# Patient Record
Sex: Male | Born: 1960 | Race: White | Hispanic: No | Marital: Married | Smoking: Never smoker
Health system: Southern US, Community
[De-identification: ages and names within clinical notes are randomized; demographics above are authoritative.]

## PROBLEM LIST (undated history)

## (undated) DIAGNOSIS — E785 Hyperlipidemia, unspecified: Secondary | ICD-10-CM

## (undated) DIAGNOSIS — I781 Nevus, non-neoplastic: Secondary | ICD-10-CM

## (undated) DIAGNOSIS — F419 Anxiety disorder, unspecified: Secondary | ICD-10-CM

## (undated) DIAGNOSIS — R000 Tachycardia, unspecified: Secondary | ICD-10-CM

## (undated) DIAGNOSIS — N400 Enlarged prostate without lower urinary tract symptoms: Secondary | ICD-10-CM

## (undated) DIAGNOSIS — M5430 Sciatica, unspecified side: Secondary | ICD-10-CM

## (undated) HISTORY — DX: Hyperlipidemia, unspecified: E78.5

## (undated) HISTORY — DX: Nevus, non-neoplastic: I78.1

## (undated) HISTORY — PX: VASECTOMY: SHX75

## (undated) HISTORY — DX: Benign prostatic hyperplasia without lower urinary tract symptoms: N40.0

## (undated) HISTORY — DX: Sciatica, unspecified side: M54.30

## (undated) HISTORY — DX: Tachycardia, unspecified: R00.0

## (undated) HISTORY — DX: Anxiety disorder, unspecified: F41.9

---

## 2010-07-12 ENCOUNTER — Telehealth: Payer: Self-pay | Admitting: *Deleted

## 2010-07-12 ENCOUNTER — Telehealth: Payer: Self-pay | Admitting: Cardiovascular Disease

## 2010-07-12 NOTE — Telephone Encounter (Signed)
Informed no caffeine 12 hrs prior and nothing to eat 4hrs prior to stress echo, pt not on beta blocker. Pt verbalized understanding. Alfonso Ramus RN

## 2010-07-12 NOTE — Telephone Encounter (Signed)
Recvd message from patient asking for price of Stress Echo advised I can only offer a rough estimate.  Patient is going to have the Nuclear Stress Test and Echo at [facility] which is scheduled on  Thurs 07/14/10.  Patient asked how early we could get the Stress Echo, called and per [facility] we can do this on 07/21/10 4pm.  Spoke with patient, he wants to have this done.  Called and scheduled test.

## 2010-07-15 ENCOUNTER — Encounter: Payer: Self-pay | Admitting: *Deleted

## 2010-07-15 ENCOUNTER — Encounter: Payer: Self-pay | Admitting: Cardiovascular Disease

## 2010-07-15 ENCOUNTER — Ambulatory Visit (INDEPENDENT_AMBULATORY_CARE_PROVIDER_SITE_OTHER): Payer: BC Managed Care – PPO | Admitting: Cardiovascular Disease

## 2010-07-15 VITALS — BP 120/78 | HR 72 | Wt 202.0 lb

## 2010-07-15 DIAGNOSIS — R000 Tachycardia, unspecified: Secondary | ICD-10-CM

## 2010-07-15 DIAGNOSIS — I781 Nevus, non-neoplastic: Secondary | ICD-10-CM | POA: Insufficient documentation

## 2010-07-15 DIAGNOSIS — J309 Allergic rhinitis, unspecified: Secondary | ICD-10-CM | POA: Insufficient documentation

## 2010-07-15 DIAGNOSIS — E785 Hyperlipidemia, unspecified: Secondary | ICD-10-CM

## 2010-07-15 DIAGNOSIS — M5430 Sciatica, unspecified side: Secondary | ICD-10-CM | POA: Insufficient documentation

## 2010-07-15 DIAGNOSIS — F419 Anxiety disorder, unspecified: Secondary | ICD-10-CM | POA: Insufficient documentation

## 2010-07-15 NOTE — Assessment & Plan Note (Signed)
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 We had long discussion concerning the mechanism of AVNRT. He brought up the possibility that this could be Wolff-Parkinson-White. I told him that the cardiologist in [facility] recognized that very quickly but instead the cardiologist told him that he had AV nodal reentrant tachycardia.   I did not repeat the EKG today since we'll be getting an EKG next Thursday.  We will schedule him for a stress test next week. We've had a long discussion regarding the termination of tachycardia including Valsalva maneuver, stimulation of the diving reflex, and carotid sinus massage. At this point, Francisco Harris does not really want to have an EP study with RF ablation. So far his symptoms have only occurred once every 4  years and it  seems to be a fairly aggressive approach for a problem that only happens every several years. I'll see him in one month.

## 2010-07-15 NOTE — Patient Instructions (Signed)
Stres echo next Thursday. [address]

## 2010-07-15 NOTE — Progress Notes (Signed)
Francisco Harris Date of Birth  [birth year] [facility] / [facility] [address]     [address] [place], [place]  [address] [phone]  Fax  [phone]  History of Present Illness:  Francisco Harris is a middle-age gentleman who has known for many years.  We went to high school together. He presents today for a second opinion regarding episodes of tachycardia.  Francisco Harris has been a very healthy man all his life. He played competitive football and played [employer] and  for the [employer].  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  He has noticed episodes of tachycardia all his life. These typically occur at various times related to exercise. They would only last a second or 2 and were not associated with any specific complications such as dizziness, shortness of breath or chest pain. He had one particularly bad episode 3 years ago.  Workup at that time was negative.  Week or so ago he had a prolonged episode of tachycardia. This episode occurred right after he had completed a 3 mile run.  It is described as a rapid tachycardia. It lasted about 30 minutes. It eventually broke.  He was seen by his medical doctor and then by cardiologist in [facility]. It was recommended that he have a stress nuclear study and an echocardiogram. It was also suggested that he probably needs an EP study with ablation of his AV node reentrant tachycardia.  There was no mention of WPW.  He called me for second opinion. I recommended that we do a stress echocardiogram which should give [place] this information regarding his exercise capacity and The possibility of ischemia.  He's not had any episodes since that time.  He has a second kind of palpitation which clinically sounds like a premature ventricular contraction. He's had this for years. The last service per second and are not associated with shortness breath, chest pain, syncope, or presyncope.   Current Outpatient  Prescriptions  Medication Sig Dispense Refill  . fenofibrate (TRICOR) 145 MG tablet Take 145 mg by mouth daily.           No Known Allergies  Past Medical History  Diagnosis Date  . Hyperlipidemia   . Tachycardia     History of Intermittent Tachycardia  . Allergic rhinitis   . Anxiety disorder   . Benign prostatic hypertrophy   . Bladder outlet obstruction   . Nevus, non-neoplastic     right (of the back)  . Sciatica     Sciatica radiculopathy     Past Surgical History  Procedure Date  . Vasectomy     History  Smoking status  . Never Smoker   Smokeless tobacco  . Never Used    History  Alcohol Use No    Family History  Problem Relation Age of Onset  . Alzheimer's disease    . Cancer      Carcinoma of The Large Intestin   . Colon cancer    . Liver cancer    . Hypertension Paternal Uncle   . Heart disease Neg Hx     Reviw of Systems:  Reviewed in the HPI.  All other systems are negative.  Physical Exam: BP 120/78  Pulse 72  Wt 202 lb (91.627 kg) The patient is alert and oriented x 3.  The mood and affect are normal.  The skin is warm and dry.  Color is normal.  The HEENT exam reveals  that the sclera are nonicteric.  The mucous membranes are moist.  The carotids are 2+ without bruits.  There is no thyromegaly.  There is no JVD.  The lungs are clear.  The chest wall is non tender.  The heart exam reveals a regular rate with a Split S1 and S2.  There are no murmurs, gallops, or rubs.  The PMI is not displaced.   Abdominal exam reveals good bowel sounds.  There is no guarding or rebound.  There is no hepatosplenomegaly or tenderness.  There are no masses.  Exam of the legs reveal no clubbing, cyanosis, or edema.  The legs are without rashes.  The distal pulses are intact.  Cranial nerves II - XII are intact.  Motor and sensory functions are intact.  The gait is normal.  Assessment / Plan:

## 2010-07-15 NOTE — Assessment & Plan Note (Signed)
He brought in his labs from his medical doctor. His triglyceride level is 80. His LDL is 114. I've asked him to continue with TriCor. He eats a very low fat diet. He works out on a regular basis.

## 2010-07-19 ENCOUNTER — Other Ambulatory Visit (HOSPITAL_COMMUNITY): Payer: Self-pay | Admitting: Cardiovascular Disease

## 2010-07-19 DIAGNOSIS — I471 Supraventricular tachycardia: Secondary | ICD-10-CM

## 2010-07-21 ENCOUNTER — Ambulatory Visit (HOSPITAL_COMMUNITY): Payer: BC Managed Care – PPO | Attending: Cardiovascular Disease

## 2010-07-21 ENCOUNTER — Ambulatory Visit (HOSPITAL_COMMUNITY): Payer: BC Managed Care – PPO | Attending: Cardiovascular Disease | Admitting: Radiology

## 2010-07-21 ENCOUNTER — Other Ambulatory Visit (HOSPITAL_COMMUNITY): Payer: Self-pay

## 2010-07-21 DIAGNOSIS — E785 Hyperlipidemia, unspecified: Secondary | ICD-10-CM | POA: Insufficient documentation

## 2010-07-21 DIAGNOSIS — R5381 Other malaise: Secondary | ICD-10-CM | POA: Insufficient documentation

## 2010-07-21 DIAGNOSIS — R0989 Other specified symptoms and signs involving the circulatory and respiratory systems: Secondary | ICD-10-CM

## 2010-07-21 DIAGNOSIS — I471 Supraventricular tachycardia, unspecified: Secondary | ICD-10-CM | POA: Insufficient documentation

## 2010-08-22 ENCOUNTER — Ambulatory Visit: Payer: BC Managed Care – PPO | Admitting: Cardiovascular Disease

## 2010-09-01 ENCOUNTER — Ambulatory Visit: Payer: BC Managed Care – PPO | Admitting: Cardiovascular Disease

## 2010-09-02 ENCOUNTER — Ambulatory Visit (INDEPENDENT_AMBULATORY_CARE_PROVIDER_SITE_OTHER): Payer: BC Managed Care – PPO | Admitting: Cardiovascular Disease

## 2010-09-02 ENCOUNTER — Encounter: Payer: Self-pay | Admitting: Cardiovascular Disease

## 2010-09-02 VITALS — BP 112/78 | HR 64 | Ht 72.0 in | Wt 204.0 lb

## 2010-09-02 DIAGNOSIS — I471 Supraventricular tachycardia: Secondary | ICD-10-CM

## 2010-09-02 DIAGNOSIS — R000 Tachycardia, unspecified: Secondary | ICD-10-CM

## 2010-09-02 DIAGNOSIS — I498 Other specified cardiac arrhythmias: Secondary | ICD-10-CM

## 2010-09-02 NOTE — Assessment & Plan Note (Signed)
Francisco Harris continues to do well. He's not having any further episodes of tachycardia. He has propranolol available if needed. I'll see him again in one year for followup visit. He is to call me sooner if he has any further episodes of SVT.

## 2010-09-02 NOTE — Progress Notes (Signed)
Francisco Harris Date of Birth  Jan 27, 1961 [facility] / [facility] [address]     [address] [place], [place]  [address] [phone]  Fax  [phone]  History of Present Illness:  Francisco Harris is a middle-aged gentleman with a history of palpitations which are thought to be do to supraventricular tachycardia and AV nodal reentrant tachycardia. He's done quite well since I last saw him. He had one episode of SVT which resolved with a Valsalva maneuver.  He's had a stress echocardiogram which was normal. He's been able to do all his normal activities without any significant problems. He is now exercising on a daily basis.  Current Outpatient Prescriptions on File Prior to Visit  Medication Sig Dispense Refill  . fenofibrate (TRICOR) 145 MG tablet Take 145 mg by mouth daily.          No Known Allergies  Past Medical History  Diagnosis Date  . Hyperlipidemia   . Tachycardia     History of Intermittent Tachycardia  . Allergic rhinitis   . Anxiety disorder   . Benign prostatic hypertrophy   . Bladder outlet obstruction   . Nevus, non-neoplastic     right (of the back)  . Sciatica     Sciatica radiculopathy     Past Surgical History  Procedure Date  . Vasectomy     History  Smoking status  . Never Smoker   Smokeless tobacco  . Never Used    History  Alcohol Use No    Family History  Problem Relation Age of Onset  . Alzheimer's disease    . Cancer      Carcinoma of The Large Intestin   . Colon cancer    . Liver cancer    . Hypertension Paternal Uncle   . Heart disease Neg Hx     Reviw of Systems:  Reviewed in the HPI.  All other systems are negative.  Physical Exam: BP 112/78  Pulse 64  Ht 6' (1.829 m)  Wt 204 lb (92.534 kg)  BMI 27.67 kg/m2 The patient is alert and oriented x 3.  The mood and affect are normal.   Skin: warm and dry.  Color is normal.    HEENT:   the sclera are nonicteric.  The mucous membranes are moist.  The  carotids are 2+ without bruits.  There is no thyromegaly.  There is no JVD.    Lungs: clear.  The chest wall is non tender.    Heart: regular rate with a normal S1 and  A split S2.  .. The PMI is not displaced.     Abdomin: good bowel sounds.  There is no guarding or rebound.  There is no hepatosplenomegaly or tenderness.  There are no masses.   Extremities:  no clubbing, cyanosis, or edema.  The legs are without rashes.  The distal pulses are intact.   Neuro:  Cranial nerves II - XII are intact.  Motor and sensory functions are intact.    The gait is normal.  Assessment / Plan:

## 2011-10-23 ENCOUNTER — Ambulatory Visit (INDEPENDENT_AMBULATORY_CARE_PROVIDER_SITE_OTHER): Payer: BC Managed Care – PPO | Admitting: Cardiovascular Disease

## 2011-10-23 ENCOUNTER — Encounter: Payer: Self-pay | Admitting: Cardiovascular Disease

## 2011-10-23 VITALS — BP 117/80 | HR 61 | Ht 72.0 in | Wt 209.0 lb

## 2011-10-23 DIAGNOSIS — E785 Hyperlipidemia, unspecified: Secondary | ICD-10-CM

## 2011-10-23 NOTE — Progress Notes (Signed)
Francisco Harris is doing very well. He's not had any further episodes of SVT. We'll continue with current medications. I'll see him again in one year for office visit, EKG, and fasting labs.

## 2011-10-23 NOTE — Assessment & Plan Note (Signed)
He continues on Tricor.  I don't have his most recent fasting lab work. He will fax me his most recent labs.  His medical doctor is managing his lipids.

## 2011-10-23 NOTE — Patient Instructions (Addendum)
Your physician wants you to follow-up in: 1 year  You will receive a reminder letter in the mail two months in advance. If you don't receive a letter, please call our office to schedule the follow-up appointment.   Your physician recommends that you return for a FASTING lipid profile: 1 year   

## 2011-11-13 ENCOUNTER — Telehealth: Payer: Self-pay | Admitting: *Deleted

## 2011-11-13 NOTE — Telephone Encounter (Signed)
received paper work in [employer] copies of lab work from [employer], left msg asking him to call and update contact numbers. Cell ID'd   his wife only and home number has recorder of a different name.

## 2012-09-17 ENCOUNTER — Encounter: Payer: Self-pay | Admitting: Cardiology

## 2012-09-20 ENCOUNTER — Ambulatory Visit: Payer: Self-pay | Admitting: Gastroenterology

## 2012-09-24 LAB — PATHOLOGY REPORT

## 2013-01-03 ENCOUNTER — Encounter: Payer: Self-pay | Admitting: Cardiovascular Disease

## 2013-01-16 ENCOUNTER — Encounter: Payer: Self-pay | Admitting: Cardiovascular Disease

## 2013-01-16 ENCOUNTER — Other Ambulatory Visit: Payer: Self-pay | Admitting: Cardiovascular Disease

## 2013-01-16 ENCOUNTER — Ambulatory Visit (INDEPENDENT_AMBULATORY_CARE_PROVIDER_SITE_OTHER): Payer: BC Managed Care – PPO | Admitting: Cardiovascular Disease

## 2013-01-16 VITALS — BP 112/86 | HR 61 | Ht 72.0 in | Wt 204.0 lb

## 2013-01-16 DIAGNOSIS — I498 Other specified cardiac arrhythmias: Secondary | ICD-10-CM

## 2013-01-16 DIAGNOSIS — I471 Supraventricular tachycardia: Secondary | ICD-10-CM

## 2013-01-16 DIAGNOSIS — E785 Hyperlipidemia, unspecified: Secondary | ICD-10-CM

## 2013-01-16 MED ORDER — PROPRANOLOL HCL 10 MG PO TABS: 10.0000 mg | ORAL_TABLET | Freq: Four times a day (QID) | ORAL | Status: DC

## 2013-01-16 NOTE — Progress Notes (Signed)
     Francisco Harris Date of Birth  10-Apr-1960       [facility]    [place] [address]  [address] [place], [place]  [address]   [place], [place]  [address] [phone]     [phone]   Fax  [phone]    Fax [phone]  Problem List: 1. SVT 2. hyperlipidemia  History of Present Illness:  Francisco Harris is doing well.  He has not had any episodes of SVT.  Exercises regularly.  Runs , lifts weights, swims regularly.   He has had 2-3 episodes of tachycardia that resolved very quickly after a valsalva.  No CP , no dypsnea.  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   No current outpatient prescriptions on file prior to visit.   No current facility-administered medications on file prior to visit.    No Known Allergies  Past Medical History  Diagnosis Date  . Hyperlipidemia   . Tachycardia     History of Intermittent Tachycardia  . Allergic rhinitis   . Anxiety disorder   . Benign prostatic hypertrophy   . Bladder outlet obstruction   . Nevus, non-neoplastic     right (of the back)  . Sciatica     Sciatica radiculopathy     Past Surgical History  Procedure Laterality Date  . Vasectomy      History  Smoking status  . Never Smoker   Smokeless tobacco  . Never Used    History  Alcohol Use No    Family History  Problem Relation Age of Onset  . Alzheimer's disease    . Cancer      Carcinoma of The Large Intestin   . Colon cancer    . Liver cancer    . Hypertension Paternal Uncle   . Heart disease Neg Hx     Reviw of Systems:  Reviewed in the HPI.  All other systems are negative.  Physical Exam: Blood pressure 112/86, pulse 61, height 6' (1.829 m), weight 204 lb (92.534 kg). General: Well developed, well nourished, in no acute distress.  Head: Normocephalic, atraumatic, sclera non-icteric, mucus membranes are moist,   Neck: Supple. Carotids are 2 + without bruits. No JVD   Lungs: Clear    Heart: RR, normal S1. S2  Abdomen: Soft, non-tender, non-distended with normal bowel sounds.  Msk:  Strength and tone are normal   Extremities: No clubbing or cyanosis. No edema.  Distal pedal pulses are 2+ and equal    Neuro: CN II - XII intact.  Alert and oriented X 3.   Psych:  Normal  ECG: Jan 16, 2013:  [employer] RBBB  Assessment / Plan:

## 2013-01-16 NOTE — Assessment & Plan Note (Signed)
He has a strong family hx of hyperlipidemia.  His Trigs are normal - LDL is 152. Will get a screening CT for coronary calcium scoring.  This may help [place] decide if he needs to start a statin   Will see him in 1 year.

## 2013-01-16 NOTE — Patient Instructions (Addendum)
Your physician wants you to follow-up in: 1 year  You will receive a reminder letter in the mail two months in advance. If you don't receive a letter, please call our office to schedule the follow-up appointment.  Your physician has recommended you make the following change in your medication:  Start propranolol 10 mg tablet as needed for palpitations. Take one every thirty minutes up to four doses.  You will get a call tomorrow to schedule the calcium screening test

## 2013-01-16 NOTE — Assessment & Plan Note (Signed)
Francisco Harris has done well.  Only has brief episodes of Afib and these resolve with a valsalva.   Will refill his propranolol - he has not needed it in years.

## 2013-01-23 ENCOUNTER — Ambulatory Visit (INDEPENDENT_AMBULATORY_CARE_PROVIDER_SITE_OTHER)
Admission: RE | Admit: 2013-01-23 | Discharge: 2013-01-23 | Disposition: A | Discharge status: Home / Self Care | Payer: BC Managed Care – PPO | Source: Ambulatory Visit | Attending: Cardiovascular Disease | Admitting: Cardiovascular Disease

## 2013-01-23 DIAGNOSIS — I498 Other specified cardiac arrhythmias: Secondary | ICD-10-CM

## 2013-01-23 DIAGNOSIS — E785 Hyperlipidemia, unspecified: Secondary | ICD-10-CM

## 2013-01-23 DIAGNOSIS — I471 Supraventricular tachycardia: Secondary | ICD-10-CM

## 2013-01-24 ENCOUNTER — Telehealth: Payer: Self-pay | Admitting: Cardiovascular Disease

## 2013-01-24 NOTE — Telephone Encounter (Signed)
Phone call   coronary calcium score of 0. He has mild hyperlipidemia.  Continue healthy diet and exercise, No medications for not   Vesta Mixer, Montez Hageman., MD, [facility] 01/24/2013, 5:52 PM Office - [phone] Pager [phone]

## 2013-11-07 ENCOUNTER — Encounter: Payer: Self-pay | Admitting: Cardiovascular Disease

## 2013-11-07 ENCOUNTER — Ambulatory Visit (INDEPENDENT_AMBULATORY_CARE_PROVIDER_SITE_OTHER): Payer: BC Managed Care – PPO | Admitting: Cardiovascular Disease

## 2013-11-07 VITALS — BP 120/80 | HR 58 | Ht 72.0 in | Wt 204.8 lb

## 2013-11-07 DIAGNOSIS — I471 Supraventricular tachycardia: Secondary | ICD-10-CM

## 2013-11-07 DIAGNOSIS — I498 Other specified cardiac arrhythmias: Secondary | ICD-10-CM

## 2013-11-07 NOTE — Patient Instructions (Signed)
Your physician recommends that you continue on your current medications as directed. Please refer to the Current Medication list given to you today.  Your physician wants you to follow-up in: 1 year. You will receive a reminder letter in the mail two months in advance. If you don't receive a letter, please call our office to schedule the follow-up appointment.  

## 2013-11-07 NOTE — Assessment & Plan Note (Signed)
[name] has been  doing much better at this point. He had a prolonged episode of supraventricular tachycardia several weeks ago. He is been drinking more V8 and has been staying hydrated on a regular basis.  I suspect that his episode of SVT was related to electrolyte abnormalities or dehydration because of activities as a [employer].    That long discussion regarding RF ablation for his SVT. For now he is only having rare episodes occurred every 3 years or so. I do not think that he necessarily needs to consider RF ablation at this time.  He's had some lab work with his medical Dr. We will have him bring over his lab work. I would want to make sure that he has a TSH drawn at some point soon.

## 2013-11-07 NOTE — Progress Notes (Signed)
Francisco Harris Date of Birth  [birth year]       [facility] Office [address]  [address] [place], [place]  [address]   [place], [place]  [address] [phone]     [phone]   Fax  [phone]    Fax [phone]  Problem List: 1. SVT 2. hyperlipidemia  History of Present Illness:  Francisco Harris is doing well.  He has not had any episodes of SVT.  Exercises regularly.  Runs , lifts weights, swims regularly.   He has had 2-3 episodes of tachycardia that resolved very quickly after a valsalva.  No CP , no dypsnea.  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   Sept. 4, 2015:  Francisco Harris had a long episode of atrial fib several weeks ago.    He was out coaching during the practice session. It was very hot and humid. He had an episode lasted 25-30 minutes. He tried   multiple Valsalva maneuvers but these were not successful. Eventually came inside and applied a cold towel to [place] for it and had some cold water. Eventually the SVT resolved and has not returned since that time.  He was a bit more fatigued the next day but overall has recovered nicely.   He has not taken any propranolol.      Current Outpatient Prescriptions on File Prior to Visit  Medication Sig Dispense Refill  . propranolol (INDERAL) 10 MG tablet TAKE 1 TABLET BY MOUTH FOUR TIMES DAILY FOR PALPITATIONS  385 tablet  3   No current facility-administered medications on file prior to visit.    No Known Allergies  Past Medical History  Diagnosis Date  . Hyperlipidemia   . Tachycardia     History of Intermittent Tachycardia  . Allergic rhinitis   . Anxiety disorder   . Benign prostatic hypertrophy   . Bladder outlet obstruction   . Nevus, non-neoplastic     right (of the back)  . Sciatica     Sciatica radiculopathy     Past Surgical History  Procedure Laterality Date  . Vasectomy      History  Smoking status  . Never Smoker   Smokeless  tobacco  . Never Used    History  Alcohol Use No    Family History  Problem Relation Age of Onset  . Alzheimer's disease    . Cancer      Carcinoma of The Large Intestin   . Colon cancer    . Liver cancer    . Hypertension Paternal Uncle   . Heart disease Neg Hx     Reviw of Systems:  Reviewed in the HPI.  All other systems are negative.  Physical Exam: Blood pressure 120/80, pulse 58, height 6' (1.829 m), weight 204 lb 12 oz (92.874 kg). General: Well developed, well nourished, in no acute distress.  Head: Normocephalic, atraumatic, sclera non-icteric, mucus membranes are moist,   Neck: Supple. Carotids are 2 + without bruits. No JVD   Lungs: Clear   Heart: RR, normal S1. S2  Abdomen: Soft, non-tender, non-distended with normal bowel sounds.  Msk:  Strength and tone are normal   Extremities: No clubbing or cyanosis. No edema.  Distal pedal pulses are 2+ and equal    Neuro: CN II - XII intact.  Alert and oriented X 3.   Psych:  Normal  ECG: Jan 16, 2013:  [employer] RBBB  Assessment /  Plan:

## 2014-06-22 IMAGING — CT CT HEART SCORING
1 of 3 series · 10 of 20 positions shown, 13 images · non-contrast
Comparison: None.

CLINICAL DATA: Risk stratification

EXAM:
Coronary Calcium Score
TECHNIQUE: The patient was scanned on a Siemens Sensation 16 slice scanner.
Axial non-contrast 3mm slices were carried out through the heart.
The data set was analyzed on a dedicated work station and scored
using the Agatson method.

[Series 6: st thins for reformat · axial · 0.71mm/px · z∈[-260,-146]mm · 10 of 140 slices shown, 13 images]
[im 13/140  vessel]
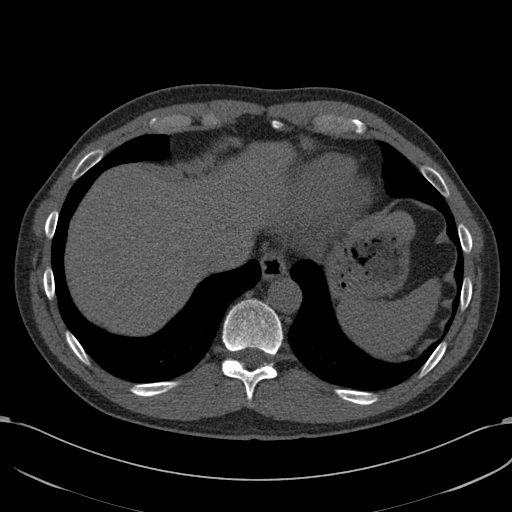
[im 13/140  lung]
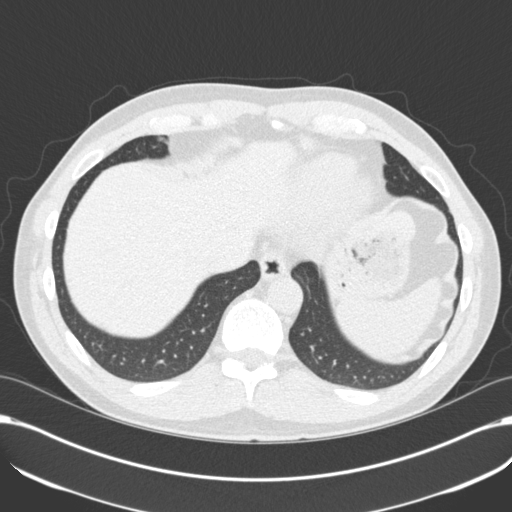
[im 26/140  vessel]
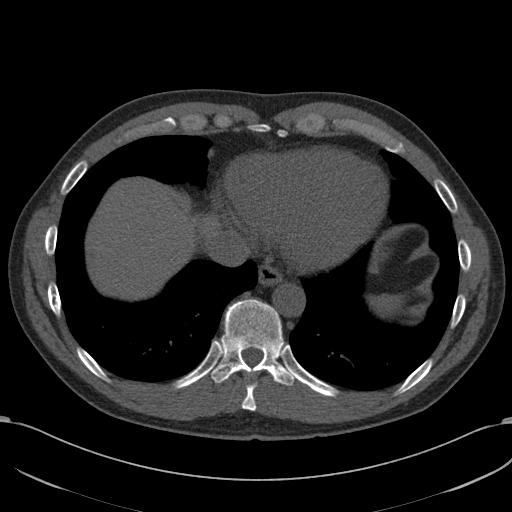
[im 38/140  vessel]
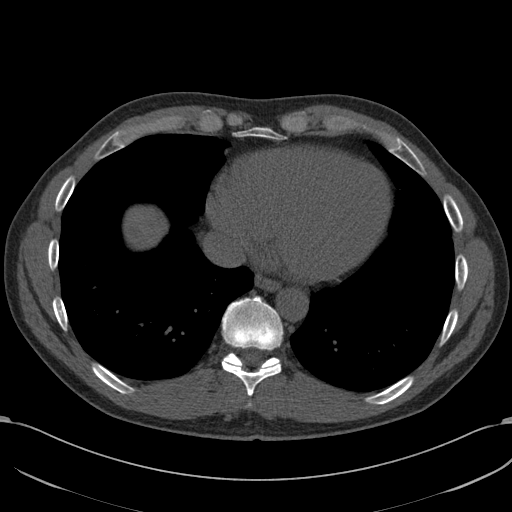
[im 51/140  vessel]
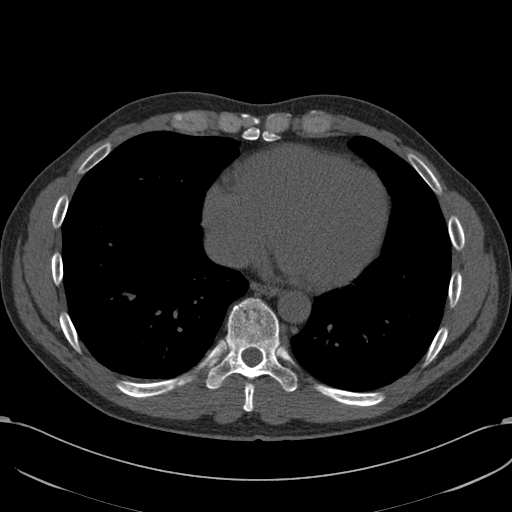
[im 64/140  vessel]
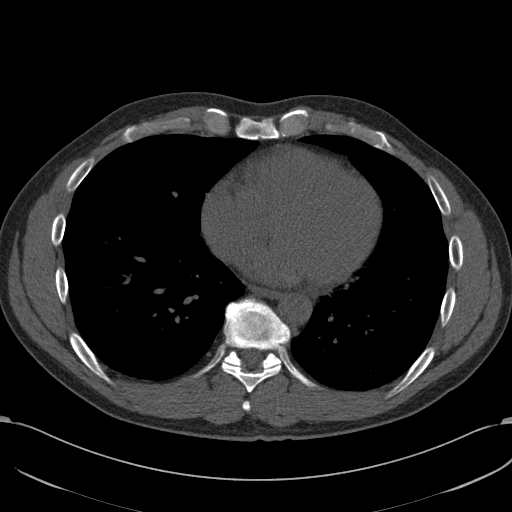
[im 64/140  lung]
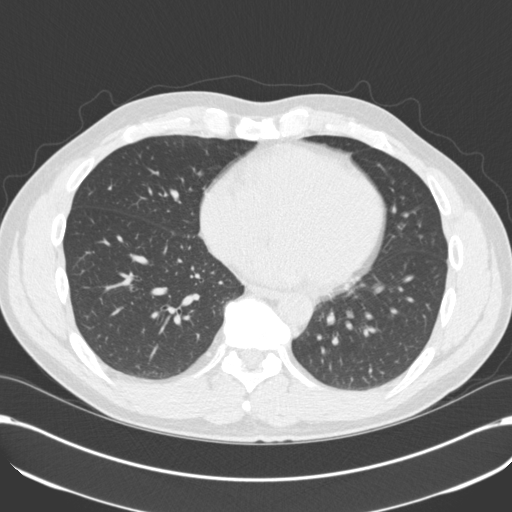
[im 76/140  vessel]
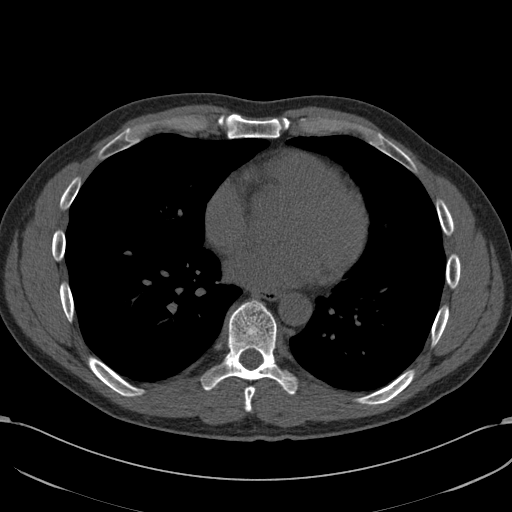
[im 89/140  vessel]
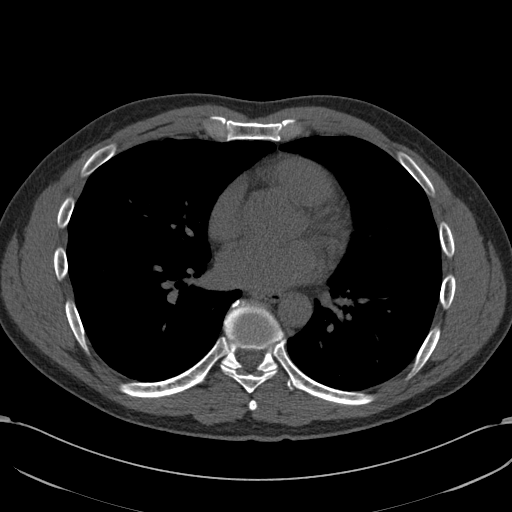
[im 102/140  vessel]
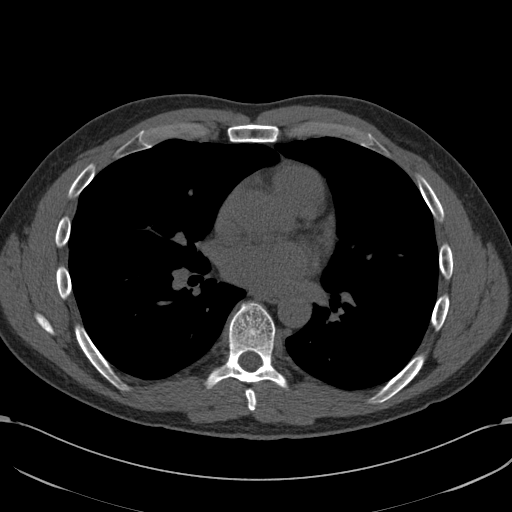
[im 114/140  vessel]
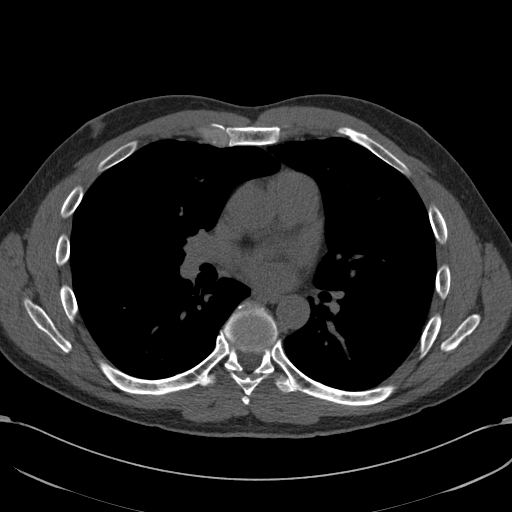
[im 114/140  lung]
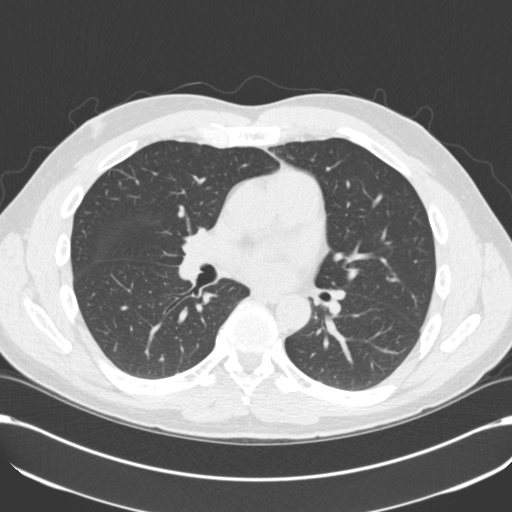
[im 127/140  vessel]
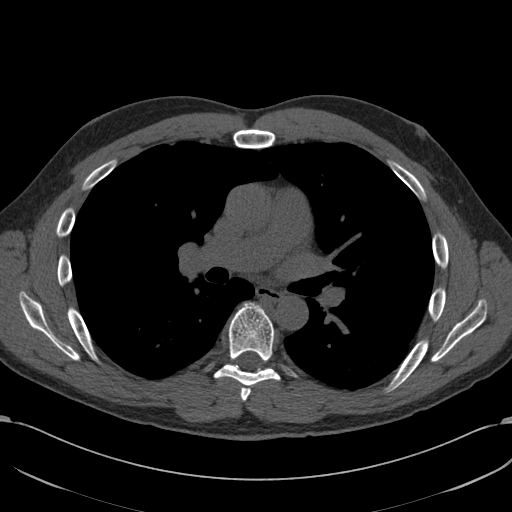

[10 of 20 positions shown; findings below may reference images not displayed]

FINDINGS: Non-cardiac: No significant non cardiac findings on limited lung and
soft tissue windows. See separate report from [REDACTED].

Ascending Aorta:  3.2 cm

Pericardium: Normal

Coronary arteries:  No calcium detected
IMPRESSION: Coronary calcium score of 0.

Enchalew Mallon

EXAM:
OVER-READ INTERPRETATION  CT CHEST

The following report is an over-read performed by radiologist Dr.
over-read does not include interpretation of cardiac or coronary
anatomy or pathology. The cardiac interpretation by the cardiologist
is attached.
FINDINGS: No pulmonary nodules. No chest wall abnormality. Degenerative
osteophytosis of the thoracic spine. .

Limited view of the upper abdomen is unremarkable.
IMPRESSION: No worrisome extracardiac findings.

Degenerative spurring of thoracic spine.

## 2015-01-27 ENCOUNTER — Encounter: Payer: Self-pay | Admitting: Cardiovascular Disease

## 2015-01-27 ENCOUNTER — Ambulatory Visit (INDEPENDENT_AMBULATORY_CARE_PROVIDER_SITE_OTHER): Payer: BC Managed Care – PPO | Admitting: Cardiovascular Disease

## 2015-01-27 VITALS — BP 130/72 | HR 54 | Ht 72.0 in | Wt 207.8 lb

## 2015-01-27 DIAGNOSIS — I471 Supraventricular tachycardia: Secondary | ICD-10-CM | POA: Diagnosis not present

## 2015-01-27 NOTE — Patient Instructions (Signed)
Medication Instructions:  Your physician recommends that you continue on your current medications as directed. Please refer to the Current Medication list given to you today.   Labwork: none  Testing/Procedures: none  Follow-Up: Your physician recommends that you schedule a follow-up appointment in: one year with Dr. [place].    Any Other Special Instructions Will Be Listed Below (If Applicable).     If you need a refill on your cardiac medications before your next appointment, please call your pharmacy.

## 2015-01-27 NOTE — Progress Notes (Signed)
Francisco Harris,Francisco Harris Date of Birth  03/16/1960       [facility]    [place] [facility] [address]  [address], [address] [place], [place]  [address]   [place], [place]  [address] [phone]     [phone]   Fax  [phone]    Fax [phone]  Problem List: 1. SVT 2. hyperlipidemia  History of Present Illness:  Francisco Harris is doing well.  He has not had any episodes of SVT.  Exercises regularly.  Runs , lifts weights, swims regularly.   He has had 2-3 episodes of tachycardia that resolved very quickly after a valsalva.  No CP , no dypsnea.  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   Sept. 4, 2015:  Francisco Harris had a long episode of  SVT  several weeks ago.    He was out coaching during the practice session. It was very hot and humid. He had an episode lasted 25-30 minutes. He tried   multiple Valsalva maneuvers but these were not successful. Eventually came inside and applied a cold towel to us for it and had some cold water. Eventually the SVT resolved and has not returned since that time.  He was a bit more fatigued the next day but overall has recovered nicely.   He has not taken any propranolol.    11/23/2016[name]:  Francisco Harris is doing well.  Occasional episodes of SVT Exercising regularly .   His cholesterol is a bit high .  Is rertired from teaching and coaching   Current Outpatient Prescriptions on File Prior to Visit  Medication Sig Dispense Refill  . propranolol (INDERAL) 10 MG tablet TAKE 1 TABLET BY MOUTH FOUR TIMES DAILY FOR PALPITATIONS 385 tablet 3   No current facility-administered medications on file prior to visit.    No Known Allergies  Past Medical History  Diagnosis Date  . Hyperlipidemia   . Tachycardia     History of Intermittent Tachycardia  . Allergic rhinitis   . Anxiety disorder   . Benign prostatic hypertrophy   . Bladder outlet obstruction   . Nevus, non-neoplastic     right (of the back)  . Sciatica    Sciatica radiculopathy     Past Surgical History  Procedure Laterality Date  . Vasectomy      History  Smoking status  . Never Smoker   Smokeless tobacco  . Never Used    History  Alcohol Use No    Family History  Problem Relation Age of Onset  . Alzheimer's disease    . Cancer      Carcinoma of The Large Intestin   . Colon cancer    . Liver cancer    . Hypertension Paternal Uncle   . Heart disease Neg Hx     Reviw of Systems:  Reviewed in the HPI.  All other systems are negative.  Physical Exam: Blood pressure 130/72, pulse 54, height 6' (1.829 m), weight 207 lb 12 oz (94.235 kg). General: Well developed, well nourished, in no acute distress.  Head: Normocephalic, atraumatic, sclera non-icteric, mucus membranes are moist,   Neck: Supple. Carotids are 2 + without bruits. No JVD   Lungs: Clear   Heart: RR, normal S1. S2  Abdomen: Soft, non-tender, non-distended with normal bowel sounds.  Msk:  Strength and tone are normal   Extremities: No clubbing or cyanosis. No edema.  Distal pedal pulses are 2+ and equal    Neuro:  CN II - XII intact.  Alert and oriented X 3.   Psych:  Normal  ECG: 01/27/2015: Sinus bradycardia at 54. EKG is otherwise normal.  Assessment / Plan:   1. SVT Able to do a valsalva an converts himself back to NSR .  No real issues Very active Continue with same medications and plan. We'll see him in one year..   2. Mild hypercholesterolemia. He follows up with his general medical doctor. He'll increase his exercise and decrease his intake of high fat foods. He'll call me if he needs any further assistance with this.     Adiel Erney, Deloris Ping, MD  01/27/2015 12:15 PM    [facility] [address] [place],  [address] [place], [place]  [address] Pager [phone] Phone: [phone]; Fax: [phone]   [facility]  [address] [place], [place]  [address] [phone]   Fax [phone]

## 2016-01-21 ENCOUNTER — Encounter: Payer: Self-pay | Admitting: Cardiovascular Disease

## 2016-02-01 ENCOUNTER — Encounter: Payer: Self-pay | Admitting: Cardiovascular Disease

## 2016-02-01 ENCOUNTER — Encounter (INDEPENDENT_AMBULATORY_CARE_PROVIDER_SITE_OTHER): Payer: Self-pay

## 2016-02-01 ENCOUNTER — Ambulatory Visit (INDEPENDENT_AMBULATORY_CARE_PROVIDER_SITE_OTHER): Payer: BC Managed Care – PPO | Admitting: Cardiovascular Disease

## 2016-02-01 VITALS — BP 140/94 | HR 59 | Ht 72.0 in | Wt 218.8 lb

## 2016-02-01 DIAGNOSIS — I10 Essential (primary) hypertension: Secondary | ICD-10-CM | POA: Diagnosis not present

## 2016-02-01 DIAGNOSIS — I471 Supraventricular tachycardia: Secondary | ICD-10-CM

## 2016-02-01 MED ORDER — PROPRANOLOL HCL 10 MG PO TABS: 10.0000 mg | ORAL_TABLET | Freq: Four times a day (QID) | ORAL | 6 refills | Status: DC | PRN

## 2016-02-01 NOTE — Patient Instructions (Signed)
Medication Instructions:  Your physician recommends that you continue on your current medications as directed. Please refer to the Current Medication list given to you today.   Labwork: None Ordered   Testing/Procedures: None Ordered   Follow-Up: Your physician wants you to follow-up in: 1 year with Dr. Nahser.  You will receive a reminder letter in the mail two months in advance. If you don't receive a letter, please call our office to schedule the follow-up appointment.   If you need a refill on your cardiac medications before your next appointment, please call your pharmacy.   Thank you for choosing [facility]! Jens Siems, RN [phone]    

## 2016-02-01 NOTE — Progress Notes (Signed)
Francisco Harris,Francisco Harris Date of Birth  04/27/1960       [facility]    [place] [facility] [address]  [address] [place], [place]  [address]   [place], [place]  [address] [phone]     [phone]   Fax  [phone]    Fax [phone]  Problem List: 1. SVT 2. hyperlipidemia    Francisco Harris is doing well.  He has not had any episodes of SVT.  Exercises regularly.  Runs , lifts weights, swims regularly.   He has had 2-3 episodes of tachycardia that resolved very quickly after a valsalva.  No CP , no dypsnea.  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   Sept. 4, 2015:  Francisco Harris had a long episode of  SVT  several weeks ago.    He was out coaching during the practice session. It was very hot and humid. He had an episode lasted 25-30 minutes. He tried   multiple Valsalva maneuvers but these were not successful. Eventually came inside and applied a cold towel to Francisco Harris for it and had some cold water. Eventually the SVT resolved and has not returned since that time.  He was a bit more fatigued the next day but overall has recovered nicely.   He has not taken any propranolol.    11/23/2016[name]:  Francisco Harris is doing well.  Occasional episodes of SVT Exercising regularly .   His cholesterol is a bit high .  Is rertired from teaching and coaching  Nov. 28, 2017:  Francisco Harris is seen today for follow-up of his supraventricular tachycardia.   BP is a bit elevated today . Ate a bit more salt this past Thanksgiving weekend.   Has brief episodes of SVT every 5 weeks or so He is able to convert himself with the valsalva.  Has not had to take a propranolol   Current Outpatient Prescriptions on File Prior to Visit  Medication Sig Dispense Refill  . propranolol (INDERAL) 10 MG tablet TAKE 1 TABLET BY MOUTH FOUR TIMES DAILY FOR PALPITATIONS (Patient not taking: Reported on 02/01/2016) 385 tablet 3   No current facility-administered medications on file prior to  visit.     No Known Allergies  Past Medical History:  Diagnosis Date  . Allergic rhinitis   . Anxiety disorder   . Benign prostatic hypertrophy   . Bladder outlet obstruction   . Hyperlipidemia   . Nevus, non-neoplastic    right (of the back)  . Sciatica    Sciatica radiculopathy   . Tachycardia    History of Intermittent Tachycardia    Past Surgical History:  Procedure Laterality Date  . VASECTOMY      History  Smoking Status  . Never Smoker  Smokeless Tobacco  . Never Used    History  Alcohol Use No    Family History  Problem Relation Age of Onset  . Alzheimer's disease    . Cancer      Carcinoma of The Large Intestin   . Colon cancer    . Liver cancer    . Hypertension Paternal Uncle   . Heart disease Neg Hx     Reviw of Systems:  Reviewed in the HPI.  All other systems are negative.  Physical Exam: Blood pressure (!) 140/94, pulse (!) 59, height 6' (1.829 m), weight 218 lb 12.8 oz (99.2 kg). General: Well developed, well nourished, in no acute distress.  Head: Normocephalic, atraumatic, sclera  non-icteric, mucus membranes are moist,   Neck: Supple. Carotids are 2 + without bruits. No JVD   Lungs: Clear   Heart: RR, normal S1. S2  Abdomen: Soft, non-tender, non-distended with normal bowel sounds.  Msk:  Strength and tone are normal   Extremities: No clubbing or cyanosis. No edema.  Distal pedal pulses are 2+ and equal    Neuro: CN II - XII intact.  Alert and oriented X 3.   Psych:  Normal  ECG: 02/01/16    Sinus brady at 59.   INc. RBBB   Assessment / Plan:   1. SVT Able to do a valsalva an converts himself back to NSR .  No real issues Very active Continue with same medications and plan. We'll see him in one year..   2. Mild hypercholesterolemia. He follows up with his general medical doctor. He'll increase his exercise and decrease his intake of high fat foods. He'll call me if he needs any further assistance with this.  3.  Hypertension: Francisco Harris presents today with very minimal elevation of his blood pressure. He's been eating a little bit of extra salt recent. I suspect that this is the cause. I have encouraged him to watch his salt intake and to resume his regular aerobic exercise regimen. He'll continue to follow his blood pressures and will send Francisco Harris his blood pressure readings in several weeks.   Kristeen MissPhilip Amira Podolak, MD  02/01/2016 11:00 AM    [facility] [address],  [address] [place], [place]  [address] Pager [phone]- [phone] Phone: [phone](336) [phone]; Fax: [phone]   [facility]  [address] [place], [place]  [address] [phone]   Fax [phone]

## 2016-02-02 ENCOUNTER — Telehealth: Payer: Self-pay | Admitting: Cardiovascular Disease

## 2016-02-02 NOTE — Telephone Encounter (Signed)
[facility] and pt needs to clarify some info that was discussed yesterday-pt aware Marcelino DusterMichelle out this pm

## 2016-02-02 NOTE — Telephone Encounter (Signed)
Pt states [facility] and had further questions about taking BP, what makes BP high, and how to report BPs.  I reviewed how to take a proper BP and when to take it. I reviewed HTN etiology and genetic vs. not genetic. I advised pt to return to normal exercise routine and healthy diet (per note @ 02/02/16 OV). I reviewed low sodium/heart healthy diet.  I advised him to take BPs in the am before exercise and pm after at least 10 minutes of rest.  I advised him to record HR and BP, cb in 2 weeks and report to Dr. Elease HashimotoNahser. He voiced understanding, thanks, and agreed with plan.

## 2016-02-25 MED ORDER — LOSARTAN POTASSIUM 50 MG PO TABS: 50.0000 mg | ORAL_TABLET | Freq: Every day | ORAL | 11 refills | Status: DC

## 2016-02-25 NOTE — Addendum Note (Signed)
Addended by: Levi AlandSWINYER, MICHELLE M on: 02/25/2016 01:58 PM   Modules accepted: Orders

## 2016-02-25 NOTE — Telephone Encounter (Signed)
Lets try low dose Losartan 50 mg a day  - typically very well tolerated.  We may have to add HCTZ if the BP remains elevated  BMP in 3 weeks.

## 2016-02-25 NOTE — Telephone Encounter (Signed)
Spoke with patient and reviewed Dr. Harvie BridgeNahser's advice.  Patient verbalized understanding and agreement to start Losartan 50 mg.  I advised him to continue to monitor BP and call us back to report and to stay well hydrated.  I advised if he gets dizzy or light-headed after starting the medication, to decrease dose to 1/2 tab and call us to report.  He verbalized understanding and agreement with plan.

## 2016-02-25 NOTE — Telephone Encounter (Signed)
Called patient to check on his BP readings.  He reports BP is excellent on the days that he exercises vigorously. Reports systolic readings of 110's to 161130 mmHg and diastolic readings of 70's to 86 mmHg. He reports BP readings that are higher on the days he does not exercise, even when he is feeling relaxed.  He is consistently getting readings of 140's to 158 mmHg systolic and 86 to 96 mmHg diastolic.  States he has been more conscious of eating less sodium since his office visit and is concerned about the high readings despite changes in his diet. I advised that I will forward to Dr. Elease HashimotoNahser for review and call him back with his advice.  Patient verbalized understanding and agreement and thanked me for the call.

## 2016-03-15 ENCOUNTER — Telehealth: Payer: Self-pay | Admitting: Cardiovascular Disease

## 2016-03-15 DIAGNOSIS — E7801 Familial hypercholesterolemia: Secondary | ICD-10-CM

## 2016-03-15 NOTE — Telephone Encounter (Signed)
°  New Prob   Pt has some questions regarding most recent follow up. Please call.

## 2016-03-15 NOTE — Telephone Encounter (Signed)
Called, LM to CB

## 2016-03-20 NOTE — Telephone Encounter (Signed)
His BP readings are great He is on a low dose Losartan and seems to be tolerating it well He can decrease the dose to 25 mg if he would like  His weight loss has likely had a beneficial effect on his lipids We should get a fasting lipid profile soon.

## 2016-03-20 NOTE — Telephone Encounter (Signed)
Patient wanted to discuss recent BP readings and find out if Dr. Elease HashimotoNahser thinks he will always have to take losartan 50 mg.     Since starting losartan on 12/22, he has increased exercise intensity , doing cardio 30-45 minutes 6-7 days per week.  Has been really watching sodium, and he now weighs 210 lbs.  (8 pounds less than at OV in Nov).  Most recent daily BPs: 120/75, 117/79, 120/66, 107/71, 118/73    Had one higher reading during a day he was unusually stressed: 148/87.   I advised that his lifestyle changes are playing a part in his improved readings, but also the small dose of losartan may also be helping.        Advised patient to continue losartan, and that I am forwarding to Dr. Elease HashimotoNahser for review and any new recommendations.  He will continue to monitor BP daily.

## 2016-03-21 NOTE — Telephone Encounter (Signed)
Left message for patient to call back  

## 2016-03-31 NOTE — Telephone Encounter (Signed)
These readings look good

## 2016-03-31 NOTE — Telephone Encounter (Signed)
Spoke with patient and advised him that Dr. Elease HashimotoNahser would like to get fasting cholesterol level soon. He states he is due for lab work with his PCP so he will call to find out when that will be done and will request cholesterol (which he states they usually check anyway). He will provide results to us once they are complete.  In regards to his BP, he states he has been very careful about checking his BP consistently and has focused on reducing sodium even further.  He states he is now swimming daily.  His last week of BP readings: 1/25 116/79 1/24 111/77 1/23 122/81 1/22 117/71 1/21 109/76 1/20 126/80 1/19 122/79 Stopped losartan yesterday and was going to monitor BP for 1 week to see if BP remains low off medication since he has reduced sodium and does 30 min of cardiovascular exercise daily. I advised him to call back next week to report numbers.  He verbalized understanding and agreement and thanked me for the call.

## 2016-04-13 MED ORDER — LOSARTAN POTASSIUM 25 MG PO TABS: 25.0000 mg | ORAL_TABLET | Freq: Every day | ORAL | 3 refills | Status: DC

## 2016-04-13 NOTE — Telephone Encounter (Signed)
His BP looks great   I would like to have him get a Lipomed profile ( LDL particle number ) to help us decide what to do with the elevated LDL  I think this is about as good as his lipids are going to get with diet and exercise.

## 2016-04-13 NOTE — Telephone Encounter (Signed)
Received call from patient to follow-up on BP readings and to report lab work.  He reports the following:  Total cholesterol 230 HDL 41.9 Triglycerides 95 LDL 169 I asked him to mail hard copies of his lab work to our office. He states he has family hx of hyperlipidemia (mother, father, grandparents, siblings) but no one has had CAD that he is aware of. He states he does not understand why his triglycerides decreased from 152 to 95 but HDL only improved slightly. He states he has significantly improved his exercise regimen to 5 to 7 days/week and has improved diet.  He reports eating healthy fat including salmon and have decreased intake of red meat to 2 days per week. He states he took tricor in the past (2008) which significantly improved his numbers but his PCP took him off the medication in 2012. He states [place] because his numbers didn't make sense for his lifestyle and physical exam. He has years of lab work available and I advised him that I will route this to Dr. Elease HashimotoNahser and that he may want additional information in the future.  In regards to his BP, he states he has been getting systolic readings of 110-120 mmHg and diastolic readings of 60-70 mmHg on losartan 25 mg daily. He reports he feels great. I advised him to continue current medications. He is aware that Dr. Elease HashimotoNahser will review information about cholesterol and that I will call him back in the near future to discuss his thoughts. He thanked me for the call.

## 2016-04-14 NOTE — Telephone Encounter (Signed)
Spoke with patient and advised him to continue losartan 25 mg once daily for BP.  Advised him that Dr. Elease HashimotoNahser would like to get NMR to evaluate particle number before making a decision on treating cholesterol.  He states he was given Rx for pravastatin by PCP and was going to get it filled. I advised that he will need to decide who is going to manage his cholesterol, us or PCP. He states he would like to proceed with getting this lab test and then discuss results with Dr. Elease HashimotoNahser. He asked me to note that he took tricor for 6 years and had excellent results - was taken off by his current PCP because he said he didn't like the medication. He would like to know Dr. Harvie BridgeNahser's opinion about tricor (fenofibrate).  [place] in [place] closer [place]. I advised I will send a message to the [place] office to get that scheduled and will call him back with that information. He verbalized understanding and agreement and thanked me for the call.

## 2016-04-17 NOTE — Telephone Encounter (Signed)
Received message from our [place] office that patient is scheduled for lab appointment on 2/19. [facility].

## 2016-04-24 ENCOUNTER — Other Ambulatory Visit
Admission: RE | Admit: 2016-04-24 | Discharge: 2016-04-24 | Disposition: A | Discharge status: Home / Self Care | Payer: BC Managed Care – PPO | Source: Ambulatory Visit | Attending: Cardiovascular Disease | Admitting: Cardiovascular Disease

## 2016-04-24 ENCOUNTER — Other Ambulatory Visit: Payer: Self-pay

## 2016-04-24 ENCOUNTER — Other Ambulatory Visit: Payer: BC Managed Care – PPO

## 2016-04-24 ENCOUNTER — Telehealth: Payer: Self-pay | Admitting: Cardiovascular Disease

## 2016-04-24 DIAGNOSIS — E782 Mixed hyperlipidemia: Secondary | ICD-10-CM | POA: Insufficient documentation

## 2016-04-24 DIAGNOSIS — E7801 Familial hypercholesterolemia: Secondary | ICD-10-CM

## 2016-04-24 DIAGNOSIS — E78019 Familial hypercholesterolemia, unspecified: Secondary | ICD-10-CM

## 2016-04-24 NOTE — Telephone Encounter (Signed)
Follow Up    Alona BeneJoyce from [facility] calling about pt labs. States she was transferred several times and keeps getting the wrong dept.

## 2016-04-24 NOTE — Telephone Encounter (Signed)
Alona [facility]Joyce at [facility] was not sure which lab order to order for patient's NMR lipid. The lab at our office  stated that she would need to use the Black and Gold tubes for blood test. Looked up list on [facility] website, and matched test with the one that uses Black and Gold tubes.

## 2016-04-26 LAB — MISC LABCORP TEST (SEND OUT): Labcorp test code: 884247

## 2016-05-02 ENCOUNTER — Telehealth: Payer: Self-pay | Admitting: Cardiovascular Disease

## 2016-05-02 MED ORDER — ROSUVASTATIN CALCIUM 10 MG PO TABS: 10.0000 mg | ORAL_TABLET | Freq: Every day | ORAL | 11 refills | Status: DC

## 2016-05-02 NOTE — Telephone Encounter (Signed)
Left detailed message on patient's voice mail regarding Dr. Harvie BridgeNahser's advice to start rosuvastatin 10 mg daily. I advised we will recheck blood work in 3 months and advised him to call back with questions or concerns.

## 2016-05-02 NOTE — Telephone Encounter (Signed)
His LDL particle number is >1700 Pravachol will not be strong enough. Lets try Rosuvastatin 10 mg a day Check lipids and NMR lipid panel in 3 months

## 2016-05-05 ENCOUNTER — Telehealth: Payer: Self-pay | Admitting: Cardiovascular Disease

## 2016-05-05 DIAGNOSIS — E785 Hyperlipidemia, unspecified: Secondary | ICD-10-CM

## 2016-05-05 NOTE — Telephone Encounter (Signed)
Called ,  Left message that I would try back later today  Or on Monday     Kristeen MissPhilip Nahser, MD  05/05/2016 12:55 PM    [facility] [address]  [address] [place], [place]  [address] Pager [phone]- [phone] Phone: [phone](336) [phone]; Fax: [phone]) [phone]

## 2016-05-05 NOTE — Telephone Encounter (Signed)
New message    Pt is calling about Michelle's message-he has questions about his blood work.

## 2016-05-05 NOTE — Telephone Encounter (Signed)
Follow Up:    Returning your call from 05-02-16,concerning his Rosuvastatin.

## 2016-05-05 NOTE — Telephone Encounter (Signed)
Received call from patient. I advised him that Dr. Elease HashimotoNahser was trying to call him to discuss the need to start Rosuvastatin. I reviewed the patient's lab results and Dr. Elease HashimotoNahser' advice and reasoning for prescribing Rosuvastatin 10 mg. I answered his questions to his satisfaction. I advised him to call back if he does not tolerate this dose. I advised that he will need repeat lab work in 3 months and advised him that I will call him closer to time to schedule. I requested that he have lab work completed at our office so that we can order all of the components of the particle number and lipid profile that we like. He verbalized understanding and agreement with plan and thanked me for the call.

## 2016-05-10 ENCOUNTER — Telehealth: Payer: Self-pay | Admitting: Cardiovascular Disease

## 2016-05-10 NOTE — Telephone Encounter (Signed)
Left message for patient that it is OK to take both of the medications. Instructed him to call with any questions or concerns.

## 2016-05-10 NOTE — Telephone Encounter (Signed)
New Message     Pt has questions about  losartan (COZAAR) 25 MG tablet Take 1 tablet (25 mg total) by mouth daily.   rosuvastatin (CRESTOR) 10 MG tablet Take 1 tablet (10 mg total) by mouth daily.   Will there be an interaction with either of these medications

## 2016-08-03 ENCOUNTER — Telehealth: Payer: Self-pay | Admitting: Nurse Practitioner

## 2016-08-03 NOTE — Telephone Encounter (Signed)
-----   Message from Francisco AlandMichelle M Swinyer, RN sent at 05/02/2016  1:47 PM EST ----- Recheck 3 mo fasting labs  Patient starting rosuvastatin 10 mg 2/27

## 2016-08-03 NOTE — Telephone Encounter (Signed)
Spoke with patient about scheduling 3 month repeat fasting lab work. He states he has lab work scheduled with PCP on Monday. I advised him that we specifically wanted to check a repeat NMR to determine if rosuvastatin is effective for elevated particle number.  He states he will send results to our office and if additional lab work is needed for Dr. Elease HashimotoNahser, that we can schedule at that time. He thanked me for the call.

## 2016-08-09 NOTE — Telephone Encounter (Signed)
Spoke with patient who states repeat fasting lab work done by PCP shows: Total chol 141 Triglycerides 105 HDL 45.5 LDL 75  He would like to know if he needs further lab testing. We had previously planned to repeat particle number at this time. I advised him to place copies of cmet and lipid panel in mail to our office and that once those are received and reviewed by Dr. Elease HashimotoNahser, I will call him back with the plan. He is currently due for follow-up in November. I advised we may get a NMR particle number prior to that visit. He verbalized understanding and agreement with plan and thanked me for the call.

## 2016-08-09 NOTE — Telephone Encounter (Signed)
Mr. Dan HumphreysMebane is calling to give you the lab results . Please Call

## 2016-08-23 NOTE — Telephone Encounter (Signed)
Spoke with patient and advised that per Dr. Elease HashimotoNahser in light of recent lab results he mailed to us, he should continue current medications and have particle number in November prior to next ov. Patient verbalized understanding and agreement with plan and thanked me for the call.

## 2017-01-01 ENCOUNTER — Telehealth: Payer: Self-pay | Admitting: Cardiovascular Disease

## 2017-01-01 NOTE — Telephone Encounter (Signed)
There is no information in this message

## 2017-01-01 NOTE — Telephone Encounter (Signed)
New Message °

## 2017-01-03 NOTE — Telephone Encounter (Signed)
New Message:     Pt wants to know if will need lab work since he just had lab work at his primary doctor? He said he had received a message that he need lab work here. He is scheduled to see Dr Elease HashimotoNahser in January.

## 2017-01-03 NOTE — Telephone Encounter (Signed)
Left message for patient that NMR is scheduled for tomorrow and if he has had this test done by PCP he can cancel. I advised him to call back with questions or concerns.

## 2017-01-03 NOTE — Telephone Encounter (Signed)
Spoke with patient and scheduled his fasting NMR test for 12/4. He is overdue for his yearly appointment so I scheduled him to see Dr. Elease HashimotoNahser on 12/10 and is aware to bring copies of his lab results to his appointment.

## 2017-01-03 NOTE — Telephone Encounter (Signed)
New Message   pt verbalized that he is returning call for rn   He has questions he has local blood word done

## 2017-01-04 ENCOUNTER — Other Ambulatory Visit: Payer: BC Managed Care – PPO

## 2017-02-06 ENCOUNTER — Other Ambulatory Visit: Payer: BC Managed Care – PPO

## 2017-02-06 DIAGNOSIS — E785 Hyperlipidemia, unspecified: Secondary | ICD-10-CM

## 2017-02-07 LAB — LIPID PANEL
CHOLESTEROL TOTAL: 156 mg/dL (ref 100–199)
Chol/HDL Ratio: 3.3 ratio (ref 0.0–5.0)
HDL: 48 mg/dL (ref >39)
LDL CALC: 89 mg/dL (ref 0–99)
TRIGLYCERIDES: 94 mg/dL (ref 0–149)
VLDL CHOLESTEROL CAL: 19 mg/dL (ref 5–40)

## 2017-02-07 LAB — NMR, LIPOPROFILE
Cholesterol: 156 mg/dL (ref 100–199)
HDL CHOLESTEROL BY NMR: 47 mg/dL (ref >39)
HDL PARTICLE NUMBER: 33.8 umol/L (ref >=30.5)
LDL Particle Number: 975 nmol/L (ref <1000)
LDL Size: 20.6 nm (ref >20.5)
LDL-C: 90 mg/dL (ref 0–99)
LP-IR Score: 44 (ref <=45)
Small LDL Particle Number: 327 nmol/L (ref <=527)
Triglycerides by NMR: 96 mg/dL (ref 0–149)

## 2017-02-07 LAB — APOLIPOPROTEIN B: Apolipoprotein B: 87 mg/dL (ref 52–135)

## 2017-02-07 LAB — LIPOPROTEIN A (LPA): LIPOPROTEIN (A): 149 nmol/L — AB (ref <75)

## 2017-02-12 ENCOUNTER — Ambulatory Visit: Payer: BC Managed Care – PPO | Admitting: Cardiovascular Disease

## 2017-02-16 ENCOUNTER — Encounter: Payer: Self-pay | Admitting: *Deleted

## 2017-02-22 ENCOUNTER — Encounter: Payer: Self-pay | Admitting: Cardiovascular Disease

## 2017-02-22 ENCOUNTER — Ambulatory Visit: Payer: BC Managed Care – PPO | Admitting: Cardiovascular Disease

## 2017-02-22 VITALS — BP 116/88 | HR 56 | Ht 72.0 in | Wt 222.0 lb

## 2017-02-22 DIAGNOSIS — E785 Hyperlipidemia, unspecified: Secondary | ICD-10-CM | POA: Diagnosis not present

## 2017-02-22 NOTE — Progress Notes (Signed)
Francisco MustyAlexander Ertle Jr. Date of Birth  08/26/1960       [facility]    [place] [facility] [address], [address]  [address], [address] [place], [place]  [address]   [place], [place]  [address] [phone]     [phone]   Fax  [phone]    Fax [phone]  Problem List: 1. SVT 2. hyperlipidemia    Francisco Harris is doing well.  Francisco Harris has not had any episodes of SVT.  Exercises regularly.  Runs , lifts weights, swims regularly.   Francisco Harris has had 2-3 episodes of tachycardia that resolved very quickly after a valsalva.  No CP , no dypsnea.  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   Sept. 4, 2015:  Francisco Harris had a long episode of  SVT  several weeks ago.    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 It was very hot and humid. Francisco Harris had an episode lasted 25-30 minutes. Francisco Harris tried   multiple Valsalva maneuvers but these were not successful. Eventually came inside and applied a cold towel to us for it and had some cold water. Eventually the SVT resolved and has not returned since that time.  Francisco Harris was a bit more fatigued the next day but overall has recovered nicely.   Francisco Harris has not taken any propranolol.    11/23/2016[name]:  Francisco Harris is doing well.  Occasional episodes of SVT Exercising regularly .   His cholesterol is a bit high .  Is rertired from teaching and coaching  Nov. 28, 2017:  Francisco Harris is seen today for follow-up of his supraventricular tachycardia.   BP is a bit elevated today . Ate a bit more salt this past Thanksgiving weekend.   Has brief episodes of SVT every 5 weeks or so Francisco Harris is able to convert himself with the valsalva.  Has not had to take a propranolol   Dec. 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   Doing well from an SVT standpoint  - no further episodes of SVT Seems to be better now that Francisco Harris is staying better hydrated.  BP has been well controlled  Now on Losartan 25 mg a day  - BP has been  great as long as Francisco Harris exercises regularly .    Current Outpatient Medications on File Prior to Visit  Medication Sig Dispense Refill  . losartan (COZAAR) 25 MG tablet Take 1 tablet (25 mg total) by mouth daily. 90 tablet 3  . propranolol (INDERAL) 10 MG tablet Take 1 tablet (10 mg total) by mouth 4 (four) times daily as needed. 90 tablet 6  . rosuvastatin (CRESTOR) 10 MG tablet Take 1 tablet (10 mg total) by mouth daily. 30 tablet 11   No current facility-administered medications on file prior to visit.     No Known Allergies  Past Medical History:  Diagnosis Date  . Allergic rhinitis   . Anxiety disorder   . Benign prostatic hypertrophy   . Bladder outlet obstruction   . Hyperlipidemia   . Nevus, non-neoplastic    right (of the back)  . Sciatica    Sciatica radiculopathy   . Tachycardia    History of Intermittent Tachycardia    Past Surgical History:  Procedure Laterality Date  . VASECTOMY      Social History   Tobacco Use  Smoking Status Never Smoker  Smokeless Tobacco Never Used  Social History   Substance and Sexual Activity  Alcohol Use No    Family History  Problem Relation Age of Onset  . Alzheimer's disease Unknown   . Cancer Unknown        Carcinoma of The Large Intestin   . Colon cancer Unknown   . Liver cancer Unknown   . Hypertension Paternal Uncle   . Heart disease Neg Hx     Reviw of Systems:  Reviewed in the HPI.  All other systems are negative.  Physical Exam: There were no vitals taken for this visit.  GEN:  Well nourished, well developed in no acute distress HEENT: Normal NECK: No JVD; No carotid bruits LYMPHATICS: No lymphadenopathy CARDIAC: RR, no murmurs, rubs, gallops RESPIRATORY:  Clear to auscultation without rales, wheezing or rhonchi  ABDOMEN: Soft, non-tender, non-distended MUSCULOSKELETAL:  No edema; No deformity  SKIN: Warm and dry NEUROLOGIC:  Alert and oriented x 3    ECG: Dec. 20,  2018:   Sinus brady at 56.   Otherwise normal ECG  Assessment / Plan:   1. SVT -his episodes of SVT are well controlled.  Francisco Harris only has rare episodes which are well controlled with Valsalva.  Francisco Harris has not had to take the propranolol in a very long time.  2. Mild hypercholesterolemia.   Continue Crestor.  Francisco Harris is LDL particle number is 975.  His lipoprotein V is normal.  His lipoprotein a is elevated at 149.  We discussed the fact that we really did not have good medications to lower this level that we keep an eye on this.  We will recheck it again next year.  3. Hypertension: -Blood pressure is on the low normal size.  Francisco Harris is on a low-dose of losartan.  I have given him the okay to stop the losartan if his blood pressure goes any lower.   Kristeen MissPhilip Zadyn Yardley, MD  02/22/2017 9:34 AM    [facility] [address],  [address] [place], [place]  [address] Pager [phone] Phone: [phone]; Fax: [phone]   [facility]  [address] [place], [place]  [address] [phone]   Fax [phone]

## 2017-02-22 NOTE — Patient Instructions (Addendum)
Medication Instructions:  Your physician recommends that you continue on your current medications as directed. Please refer to the Current Medication list given to you today.  Labwork: Your physician recommends that you return for lab work in: 1 year- BMET, LFTs, Lipids  Testing/Procedures: NONE  Follow-Up: Your physician wants you to follow-up in: 12 months with Dr. Elease HashimotoNahser. You will receive a reminder letter in the mail two months in advance. If you don't receive a letter, please call our office to schedule the follow-up appointment.   If you need a refill on your cardiac medications before your next appointment, please call your pharmacy.

## 2017-03-01 NOTE — Addendum Note (Signed)
Addended by: Levi AlandSWINYER, Elier Zellars M on: 03/01/2017 12:16 PM   Modules accepted: Orders

## 2017-03-16 ENCOUNTER — Ambulatory Visit: Payer: BC Managed Care – PPO | Admitting: Cardiovascular Disease

## 2017-03-31 ENCOUNTER — Other Ambulatory Visit: Payer: Self-pay | Admitting: Cardiovascular Disease

## 2017-04-02 ENCOUNTER — Other Ambulatory Visit: Payer: Self-pay | Admitting: Cardiovascular Disease

## 2017-04-02 MED ORDER — LOSARTAN POTASSIUM 25 MG PO TABS: 25.0000 mg | ORAL_TABLET | Freq: Every day | ORAL | 3 refills | Status: AC

## 2017-09-18 ENCOUNTER — Other Ambulatory Visit: Payer: Self-pay | Admitting: Cardiovascular Disease

## 2018-02-15 ENCOUNTER — Other Ambulatory Visit: Payer: Self-pay | Admitting: Cardiovascular Disease

## 2018-02-15 MED ORDER — ROSUVASTATIN CALCIUM 10 MG PO TABS: 10.0000 mg | ORAL_TABLET | Freq: Every day | ORAL | 0 refills | Status: DC

## 2018-02-15 NOTE — Addendum Note (Signed)
Addended by: Burnetta SabinWITTY, Shauntee Karp K on: 02/15/2018 02:27 PM   Modules accepted: Orders

## 2018-03-20 ENCOUNTER — Other Ambulatory Visit: Payer: Self-pay | Admitting: Cardiovascular Disease

## 2018-05-31 ENCOUNTER — Ambulatory Visit
Admission: RE | Admit: 2018-05-31 | Payer: BC Managed Care – PPO | Source: Home / Self Care | Admitting: Gastroenterology

## 2018-05-31 ENCOUNTER — Encounter: Admission: RE | Payer: Self-pay | Source: Home / Self Care

## 2018-05-31 SURGERY — COLONOSCOPY WITH PROPOFOL
Anesthesia: General

## 2018-11-12 ENCOUNTER — Ambulatory Visit: Payer: BC Managed Care – PPO | Admitting: Physician Assistant

## 2018-11-12 ENCOUNTER — Encounter: Payer: Self-pay | Admitting: Physician Assistant

## 2018-11-12 ENCOUNTER — Other Ambulatory Visit: Payer: Self-pay

## 2018-11-12 VITALS — BP 130/80 | HR 65 | Ht 72.0 in | Wt 225.8 lb

## 2018-11-12 DIAGNOSIS — E785 Hyperlipidemia, unspecified: Secondary | ICD-10-CM | POA: Diagnosis not present

## 2018-11-12 DIAGNOSIS — I471 Supraventricular tachycardia, unspecified: Secondary | ICD-10-CM

## 2018-11-12 DIAGNOSIS — I10 Essential (primary) hypertension: Secondary | ICD-10-CM | POA: Diagnosis not present

## 2018-11-12 NOTE — Patient Instructions (Signed)
Medication Instructions:  Your physician recommends that you continue on your current medications as directed. Please refer to the Current Medication list given to you today.  If you need a refill on your cardiac medications before your next appointment, please call your pharmacy.   Lab work: -None-  Patient is having labs a PCP tomorrow if you can please get the [place] office to fax [phone] Attention: Richardson Dopp, PA-C.   Testing/Procedures: -None   Follow-Up: At [facility], you and your health needs are our priority.  As part of our continuing mission to provide you with exceptional heart care, we have created designated Provider Care Teams.  These Care Teams include your primary Cardiologist (physician) and Advanced Practice Providers (APPs -  Physician Assistants and Nurse Practitioners) who all work together to provide you with the care you need, when you need it. You will need a follow up appointment in:  1 years.  Please call our office 2 months in advance to schedule this appointment.  You may see Mertie Moores, MD or one of the following Advanced Practice Providers on your designated Care Team: Richardson Dopp, PA-C [place], [place] . Daune Perch, NP

## 2018-11-12 NOTE — Progress Notes (Signed)
Cardiology Office Note:    Date:  11/12/2018   ID:  Francisco Harris Jr., DOB 05/15/1960, MRN [id number]  PCP:  Francisco IvanLinthavong, Francisco Harris  Cardiologist:  Francisco MissPhilip Nahser, Harris  Electrophysiologist:  None   Referring Harris: Francisco IvanLinthavong, Francisco Harris   Chief Complaint  Patient presents with  . Follow-up    SVT    History of Present Illness:    Francisco Mustylexander Vanmetre Jr. is a 58 y.o. male with:   SVT  Manages with Valsalva or prn beta-blocker   Hypertension   Hyperlipidemia   Francisco Harris was last seen by Dr. Elease HashimotoNahser in 02/2017.  He returns for follow-up.  He is here alone.  Since last seen, he has done well.  He has not had chest pain, shortness of breath, syncope.  He has episodes of SVT every 1 to 2 months.  He can usually abort the episode with a Valsalva maneuver.  He has not had to take propranolol in 5 years.  Prior CV studies:   The following studies were reviewed today:  Stress echocardiogram 07/21/2010  - With stress there is normal increase in thickening and  contractility in all segments. There is decrease in cavity size,  This is a normal stress echo study.   Past Medical History:  Diagnosis Date  . Allergic rhinitis   . Anxiety disorder   . Benign prostatic hypertrophy   . Bladder outlet obstruction   . Hyperlipidemia   . Nevus, non-neoplastic    right (of the back)  . Sciatica    Sciatica radiculopathy   . Tachycardia    History of Intermittent Tachycardia   Surgical Hx: The patient  has a past surgical history that includes Vasectomy.   Current Medications: Current Meds  Medication Sig  . losartan (COZAAR) 25 MG tablet Take 1 tablet (25 mg total) by mouth daily.  . propranolol (INDERAL) 10 MG tablet Take 1 tablet (10 mg total) by mouth 4 (four) times daily as needed.  . rosuvastatin (CRESTOR) 10 MG tablet Take 1 tablet (10 mg total) by mouth daily. Please schedule overdue appt for future refills 2nd attempt.     Allergies:   Patient has no known allergies.    Social History   Tobacco Use  . Smoking status: Never Smoker  . Smokeless tobacco: Never Used  Substance Use Topics  . Alcohol use: No  . Drug use: No     Family Hx: The patient's family history includes Alzheimer's disease in his unknown relative; Cancer in his unknown relative; Colon cancer in his unknown relative; Hypertension in his paternal uncle; Liver cancer in his unknown relative. There is no history of Heart disease.  ROS:   Please see the history of present illness.    ROS All other systems reviewed and are negative.   EKGs/Labs/Other Test Reviewed:    EKG:  EKG is  ordered today.  The ekg ordered today demonstrates normal sinus rhythm, heart rate 65, normal axis, QTC 418, incomplete right bundle branch block, no change from prior tracing  Recent Labs: No results found for requested labs within last 8760 hours.   Recent Lipid Panel Lab Results  Component Value Date/Time   CHOL 156 02/06/2017 10:38 AM   CHOL 156 02/06/2017 10:38 AM   TRIG 96 02/06/2017 10:38 AM   TRIG 94 02/06/2017 10:38 AM   HDL 47 02/06/2017 10:38 AM   HDL 48 02/06/2017 10:38 AM   CHOLHDL 3.3 02/06/2017 10:38 AM   LDLCALC 89 02/06/2017 10:38 AM  Physical Exam:    VS:  BP 130/80   Pulse 65   Ht 6' (1.829 m)   Wt 225 lb 12.8 oz (102.4 kg)   SpO2 97%   BMI 30.62 kg/m     Wt Readings from Last 3 Encounters:  11/12/18 225 lb 12.8 oz (102.4 kg)  02/22/17 222 lb (100.7 kg)  02/01/16 218 lb 12.8 oz (99.2 kg)     Physical Exam  Constitutional: He is oriented to person, place, and time. He appears well-developed and well-nourished. No distress.  HENT:  Head: Normocephalic and atraumatic.  Neck: Neck supple. No JVD present.  Cardiovascular: Normal rate, regular rhythm, S1 normal and S2 normal.  No murmur heard. Pulmonary/Chest: Breath sounds normal. He has no rales.  Abdominal: Soft. There is no hepatomegaly.  Musculoskeletal:        General: No edema.  Neurological: He is alert  and oriented to person, place, and time.  Skin: Skin is warm and dry.    ASSESSMENT & PLAN:    1. SVT (supraventricular tachycardia) (HCC) Overall controlled with Valsalva maneuvers.  We discussed the rationale for proceeding with radiofrequency catheter ablation.  His SVT has been stable over the last several years.  Should he have an increase in frequency or duration, we could refer him to EP.  For now, continue Valsalva and as needed propranolol.  2. Essential hypertension The patient's blood pressure is controlled on his current regimen.  Continue current therapy.   3. Hyperlipidemia LDL goal <100 Continue statin therapy.  Request recent labs from PCP.   Dispo:  Return in about 1 year (around 11/12/2019) for Routine Follow Up, w/ Dr. Acie Fredrickson, or Richardson Dopp, PA-C, (virtual or in-person).   Medication Adjustments/Labs and Tests Ordered: Current medicines are reviewed at length with the patient today.  Concerns regarding medicines are outlined above.  Tests Ordered: Orders Placed This Encounter  Procedures  . EKG 12-Lead   Medication Changes: No orders of the defined types were placed in this encounter.   Signed, Richardson Dopp, PA-C  11/12/2018 5:08 PM    [facility] [place], [place], [place]  [address] Phone: [phone]; Fax: [phone]

## 2018-11-22 ENCOUNTER — Telehealth: Payer: Self-pay

## 2018-11-22 NOTE — Telephone Encounter (Signed)
Notes recorded by Frederik Schmidt, RN on 11/22/2018 at 1:10 PM EDT  [id number] 9/18  ------

## 2018-11-22 NOTE — Telephone Encounter (Signed)
-----   Message from Scott T Weaver, PA-C sent at 11/22/2018 12:47 PM EDT ----- LDL 93.  LFTs stable.  PLAN:   - Continue current medications and follow up as planned.  Scott Weaver, PA-C    11/22/2018 12:46 PM 

## 2018-11-25 ENCOUNTER — Telehealth: Payer: Self-pay

## 2018-11-25 NOTE — Telephone Encounter (Signed)
Notes recorded by Frederik Schmidt, RN on 11/25/2018 at 8:35 AM EDT  Lpm with results 9/21  ------

## 2018-11-25 NOTE — Telephone Encounter (Signed)
-----   Message from Liliane Shi, PA-C sent at 11/22/2018 12:47 PM EDT ----- LDL 93.  LFTs stable.  PLAN:   - Continue current medications and follow up as planned.  Richardson Dopp, PA-C    11/22/2018 12:46 PM

## 2019-03-31 ENCOUNTER — Ambulatory Visit
Admission: RE | Admit: 2019-03-31 | Payer: BC Managed Care – PPO | Source: Home / Self Care | Admitting: Internal Medicine

## 2019-03-31 ENCOUNTER — Encounter: Admission: RE | Payer: Self-pay | Source: Home / Self Care

## 2019-03-31 SURGERY — COLONOSCOPY WITH PROPOFOL
Anesthesia: General

## 2019-12-30 ENCOUNTER — Encounter: Payer: Self-pay | Admitting: Cardiovascular Disease

## 2019-12-30 NOTE — Progress Notes (Signed)
Francisco Harris. Date of Birth  25-Sep-1960       [facility] [address], [address]  [address] [place], [place]  [address]   [place], [place]  [address] [phone]     [phone]   Fax  [phone]    Fax [phone]  Problem List: 1. SVT 2. hyperlipidemia    Francisco Harris is doing well.  He has not had any episodes of SVT.  Exercises regularly.  Runs , lifts weights, swims regularly.   He has had 2-3 episodes of tachycardia that resolved very quickly after a valsalva.  No CP , no dypsnea.  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   Sept. 4, 2015:  Francisco Harris had a long episode of  SVT  several weeks ago.    He was out coaching during the practice session. It was very hot and humid. He had an episode lasted 25-30 minutes. He tried   multiple Valsalva maneuvers but these were not successful. Eventually came inside and applied a cold towel to [place] for it and had some cold water. Eventually the SVT resolved and has not returned since that time.  He was a bit more fatigued the next day but overall has recovered nicely.   He has not taken any propranolol.    11/23/2016[name] is doing well.  Occasional episodes of SVT Exercising regularly .   His cholesterol is a bit high .  Is rertired from teaching and coaching  Nov. 28, 2017:  Francisco Harris is seen today for follow-up of his supraventricular tachycardia.   BP is a bit elevated today . Ate a bit more salt this past Thanksgiving weekend.   Has brief episodes of SVT every 5 weeks or so He is able to convert himself with the valsalva.  Has not had to take a propranolol   Dec. 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   Doing well from an SVT standpoint  - no further episodes of SVT Seems to be better now that he is staying better hydrated.  BP has been well controlled  Now on Losartan 25 mg a day  - BP has been  great as long as he exercises regularly .   Oct. 27, 2021:  Francisco Harris is seen today for follow up of his SVT and mild hyperlipidemia Working out regularly  Has occasional episodes of SVT . Might have an episode every 4-5 weeks.  Does a Valsalva and the episodes resolve quickly .  [facility]ids from [facility] look good   Current Outpatient Medications on File Prior to Visit  Medication Sig Dispense Refill  . losartan (COZAAR) 25 MG tablet Take 1 tablet (25 mg total) by mouth daily. 90 tablet 3  . propranolol (INDERAL) 10 MG tablet Take 1 tablet (10 mg total) by mouth 4 (four) times daily as needed. 90 tablet 6  . rosuvastatin (CRESTOR) 10 MG tablet Take 1 tablet (10 mg total) by mouth daily. Please schedule overdue appt for future refills 2nd attempt. 15 tablet 0   No current facility-administered medications on file prior to visit.    No Known Allergies  Past Medical History:  Diagnosis Date  . Allergic rhinitis   . Anxiety disorder   . Benign prostatic hypertrophy   . Bladder outlet obstruction   . Hyperlipidemia   . Nevus, non-neoplastic  right (of the back)  . Sciatica    Sciatica radiculopathy   . Tachycardia    History of Intermittent Tachycardia    Past Surgical History:  Procedure Laterality Date  . VASECTOMY      Social History   Tobacco Use  Smoking Status Never Smoker  Smokeless Tobacco Never Used    Social History   Substance and Sexual Activity  Alcohol Use No    Family History  Problem Relation Age of Onset  . Alzheimer's disease Unknown   . Cancer Unknown        Carcinoma of The Large Intestin   . Colon cancer Unknown   . Liver cancer Unknown   . Hypertension Paternal Uncle   . Heart disease Neg Hx     Reviw of Systems:  Reviewed in the HPI.  All other systems are negative.   Physical Exam: Blood pressure 128/86, pulse 70, height 6' (1.829 m), weight 226 lb 9.6 oz (102.8 kg).  GEN:  Well nourished, well developed in no acute  distress HEENT: Normal NECK: No JVD; No carotid bruits LYMPHATICS: No lymphadenopathy CARDIAC: RRR , no murmurs, rubs, gallops RESPIRATORY:  Clear to auscultation without rales, wheezing or rhonchi  ABDOMEN: Soft, non-tender, non-distended MUSCULOSKELETAL:  No edema; No deformity  SKIN: Warm and dry NEUROLOGIC:  Alert and oriented x 3    ECG: December 31, 2019: Normal sinus rhythm at 70.  No ST or T wave changes.  Assessment / Plan:   1. SVT -Francisco Harris continues to have intermittent episodes of SVT.  He is learned to do a Valsalva very quickly when he has these episodes and they only last for a few seconds or so.  He might have an episode every 3 to 4 weeks.  On one occasion he has had to take the propranolol but this is fairly rare.  We once again discussed the Valsalva and the diving reflex.  At this point I do not think that he needs any additional medications.  These episodes really have not been bothersome at all.  We can add beta-blockers or other medications if needed.  We also could consider doing an ablation if they become a real problem.  2. Mild hypercholesterolemia.   His labs are drawn by his primary medical doctor.  His lipids were reviewed and are within normal limits.  3. Hypertension: -Blood pressure is well controlled.Kristeen Miss, MD  12/31/2019 11:32 AM    [facility] [address] [place],  [address] [place], [place]  [address] Pager [phone] Phone: [phone]; Fax: [phone]

## 2019-12-31 ENCOUNTER — Ambulatory Visit: Payer: BC Managed Care – PPO | Admitting: Cardiovascular Disease

## 2019-12-31 ENCOUNTER — Other Ambulatory Visit: Payer: Self-pay

## 2019-12-31 ENCOUNTER — Encounter: Payer: Self-pay | Admitting: Cardiovascular Disease

## 2019-12-31 VITALS — BP 128/86 | HR 70 | Ht 72.0 in | Wt 226.6 lb

## 2019-12-31 DIAGNOSIS — E7801 Familial hypercholesterolemia: Secondary | ICD-10-CM

## 2019-12-31 DIAGNOSIS — I471 Supraventricular tachycardia: Secondary | ICD-10-CM

## 2019-12-31 NOTE — Patient Instructions (Signed)

## 2021-03-09 ENCOUNTER — Ambulatory Visit: Payer: BC Managed Care – PPO | Admitting: Cardiovascular Disease

## 2021-03-09 ENCOUNTER — Encounter: Payer: Self-pay | Admitting: Cardiovascular Disease

## 2021-03-09 ENCOUNTER — Other Ambulatory Visit: Payer: Self-pay

## 2021-03-09 VITALS — BP 138/96 | HR 79 | Ht 72.0 in | Wt 233.2 lb

## 2021-03-09 DIAGNOSIS — E7801 Familial hypercholesterolemia: Secondary | ICD-10-CM

## 2021-03-09 DIAGNOSIS — I471 Supraventricular tachycardia: Secondary | ICD-10-CM

## 2021-03-09 DIAGNOSIS — E782 Mixed hyperlipidemia: Secondary | ICD-10-CM | POA: Diagnosis not present

## 2021-03-09 NOTE — Patient Instructions (Signed)
Medication Instructions:  Your physician recommends that you continue on your current medications as directed. Please refer to the Current Medication list given to you today.  *If you need a refill on your cardiac medications before your next appointment, please call your pharmacy*   Lab Work: NONE If you have labs (blood work) drawn today and your tests are completely normal, you will receive your results only by: [place] (if you have MyChart) OR A paper copy in the mail If you have any lab test that is abnormal or we need to change your treatment, we will call you to review the results.   Testing/Procedures: Your physician has requested that you have a Coronary Calcium Score.    Follow-Up: At [facility], you and your health needs are our priority.  As part of our continuing mission to provide you with exceptional heart care, we have created designated Provider Care Teams.  These Care Teams include your primary Cardiologist (physician) and Advanced Practice Providers (APPs -  Physician Assistants and Nurse Practitioners) who all work together to provide you with the care you need, when you need it.  We recommend signing up for the patient portal called "MyChart".  Sign up information is provided on this After Visit Summary.  MyChart is used to connect with patients for Virtual Visits (Telemedicine).  Patients are able to view lab/test results, encounter notes, upcoming appointments, etc.  Non-urgent messages can be sent to your provider as well.   To learn more about what you can do with MyChart, go to [email or web address].    Your next appointment:   1 year(s)  The format for your next appointment:   In Person  Provider:   Mertie Moores, MD

## 2021-03-09 NOTE — Progress Notes (Signed)
Francisco Harris. Date of Birth  07/17/1960       [facility] Office [address]  [address], [address] [place], [place]  [address]   [place], [place]  [address] [phone]     [phone]   Fax  [phone]    Fax [phone]  Problem List: 1. SVT 2. hyperlipidemia    Francisco Harris is doing well.  He has not had any episodes of SVT.  Exercises regularly.  Runs , lifts weights, swims regularly.   He has had 2-3 episodes of tachycardia that resolved very quickly after a valsalva.  No CP , no dypsnea.  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   Sept. 4, 2015:  Francisco Harris had a long episode of  SVT  several weeks ago.    He was out coaching during the practice session. It was very hot and humid. He had an episode lasted 25-30 minutes. He tried   multiple Valsalva maneuvers but these were not successful. Eventually came inside and applied a cold towel to [place] for it and had some cold water. Eventually the SVT resolved and has not returned since that time.  He was a bit more fatigued the next day but overall has recovered nicely.   He has not taken any propranolol.    11/23/2016[name] is doing well.  Occasional episodes of SVT Exercising regularly .   His cholesterol is a bit high .  Is rertired from teaching and coaching  Nov. 28, 2017:  Francisco Harris is seen today for follow-up of his supraventricular tachycardia.   BP is a bit elevated today . Ate a bit more salt this past Thanksgiving weekend.   Has brief episodes of SVT every 5 weeks or so He is able to convert himself with the valsalva.  Has not had to take a propranolol   Dec. 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   Doing well from an SVT standpoint  - no further episodes of SVT Seems to be better now that he is staying better hydrated.  BP has been well controlled  Now on Losartan 25 mg a day  - BP has been  great as long as he exercises regularly .   Oct. 27, 2021:  Francisco Harris is seen today for follow up of his SVT and mild hyperlipidemia Working out regularly  Has occasional episodes of SVT . Might have an episode every 4-5 weeks.  Does a Valsalva and the episodes resolve quickly .  Lipids from [facility] look good    March 09, 2021:  Family hx of HLD, no CAD  Francisco Harris is seen today for follow-up of his supraventricular tachycardia.  He has mild hyperlipidemia.  He works out on a regular basis.  He is retired from [employer]. Has rare episodes of SVT  - resolves these with a quick valsalva. Also has PVCs   Has stopped running for the most part Now has doe with running  Is cycling , swims in the winter .     Current Outpatient Medications on File Prior to Visit  Medication Sig Dispense Refill   aspirin 81 MG EC tablet Take 1 tablet by mouth daily.     losartan (COZAAR) 25 MG tablet Take 1 tablet (25 mg total) by mouth daily. 90 tablet 3   rosuvastatin (CRESTOR) 10 MG tablet Take 1  tablet (10 mg total) by mouth daily. Please schedule overdue appt for future refills 2nd attempt. 15 tablet 0   propranolol (INDERAL) 10 MG tablet Take 1 tablet (10 mg total) by mouth 4 (four) times daily as needed. (Patient not taking: Reported on 03/09/2021) 90 tablet 6   No current facility-administered medications on file prior to visit.    No Known Allergies  Past Medical History:  Diagnosis Date   Allergic rhinitis    Anxiety disorder    Benign prostatic hypertrophy    Bladder outlet obstruction    Hyperlipidemia    Nevus, non-neoplastic    right (of the back)   Sciatica    Sciatica radiculopathy    Tachycardia    History of Intermittent Tachycardia    Past Surgical History:  Procedure Laterality Date   VASECTOMY      Social History   Tobacco Use  Smoking Status Never  Smokeless Tobacco Never    Social History   Substance and Sexual Activity  Alcohol Use No    Family History   Problem Relation Age of Onset   Alzheimer's disease Unknown    Cancer Unknown        Carcinoma of The Large Intestin    Colon cancer Unknown    Liver cancer Unknown    Hypertension Paternal Uncle    Heart disease Neg Hx     Reviw of Systems:  Reviewed in the HPI.  All other systems are negative.  Physical Exam: Blood pressure (!) 138/96, pulse 79, height 6' (1.829 m), weight 233 lb 3.2 oz (105.8 kg), SpO2 99 %.  GEN:  Well nourished, well developed in no acute distress HEENT: Normal NECK: No JVD; No carotid bruits LYMPHATICS: No lymphadenopathy CARDIAC: RRR , no murmurs, rubs, gallops RESPIRATORY:  Clear to auscultation without rales, wheezing or rhonchi  ABDOMEN: Soft, non-tender, non-distended MUSCULOSKELETAL:  No edema; No deformity  SKIN: Warm and dry NEUROLOGIC:  Alert and oriented x 3     ECG: March 09, 2021: Normal sinus rhythm at 79.  Normal EKG.  Assessment / Plan:   1. SVT -not having any significant episodes of supraventricular tachycardia.  He does a Valsalva maneuver which terminates the SVT very quickly.  2. Mild hypercholesterolemia.    He would like to do a coronary calcium score.  We will use this to judge whether or not he needs to increase his therapy.  We will consider increasing his statin, adding Zetia, adding PCSK9 inhibitors.  3. Hypertension: -Blood pressure is well controlled.Kristeen Miss, MD  03/09/2021 5:40 PM    [facility] [address] [place],  [address] [place], [place]  [address] Pager [phone] Phone: [phone]; Fax: [phone]

## 2021-03-31 ENCOUNTER — Ambulatory Visit (INDEPENDENT_AMBULATORY_CARE_PROVIDER_SITE_OTHER)
Admission: RE | Admit: 2021-03-31 | Discharge: 2021-03-31 | Disposition: A | Discharge status: Home / Self Care | Payer: Self-pay | Source: Ambulatory Visit | Attending: Cardiovascular Disease | Admitting: Cardiovascular Disease

## 2021-03-31 ENCOUNTER — Other Ambulatory Visit: Payer: Self-pay

## 2021-03-31 DIAGNOSIS — I471 Supraventricular tachycardia: Secondary | ICD-10-CM

## 2021-03-31 DIAGNOSIS — E7801 Familial hypercholesterolemia: Secondary | ICD-10-CM

## 2022-03-28 ENCOUNTER — Encounter: Payer: Self-pay | Admitting: Cardiovascular Disease

## 2022-03-28 NOTE — Progress Notes (Unsigned)
Francisco Harris. Date of Birth  [birth year]       [facility] Office [address], [place], [place]  [address]   [place], [place]  [address] [phone]     [phone]   Fax  [phone]    Fax [phone]  Problem List: 1. SVT 2. hyperlipidemia    Francisco Harris is doing well.  He has not had any episodes of SVT.  Exercises regularly.  Runs , lifts weights, swims regularly.   He has had 2-3 episodes of tachycardia that resolved very quickly after a valsalva.  No CP , no dypsnea.  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   Sept. 4, 2015:  Francisco Harris had a long episode of  SVT  several weeks ago.    He was out coaching during the practice session. It was very hot and humid. He had an episode lasted 25-30 minutes. He tried   multiple Valsalva maneuvers but these were not successful. Eventually came inside and applied a cold towel to [place] for it and had some cold water. Eventually the SVT resolved and has not returned since that time.  He was a bit more fatigued the next day but overall has recovered nicely.   He has not taken any propranolol.    11/23/2016[name] is doing well.  Occasional episodes of SVT Exercising regularly .   His cholesterol is a bit high .  Is rertired from teaching and coaching  Nov. 28, 2017:  Francisco Harris is seen today for follow-up of his supraventricular tachycardia.   BP is a bit elevated today . Ate a bit more salt this past Thanksgiving weekend.   Has brief episodes of SVT every 5 weeks or so He is able to convert himself with the valsalva.  Has not had to take a propranolol   Dec. 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   Doing well from an SVT standpoint  - no further episodes of SVT Seems to be better now that he is staying better hydrated.  BP has been well controlled  Now on Losartan 25 mg a day  - BP has been  great as long as he exercises regularly .   Oct. 27, 2021:  Francisco Harris is seen today for follow up of his SVT and mild hyperlipidemia Working out regularly  Has occasional episodes of SVT . Might have an episode every 4-5 weeks.  Does a Valsalva and the episodes resolve quickly .  Lipids from [facility] look good    March 09, 2021:  Family hx of HLD, no CAD  Francisco Harris is seen today for follow-up of his supraventricular tachycardia.  He has mild hyperlipidemia.  He works out on a regular basis.  He is retired from [employer]. Has rare episodes of SVT  - resolves these with a quick valsalva. Also has PVCs   Has stopped running for the most part Now has doe with running  Is cycling , swims in the winter .    March 29, 2022: Francisco Harris is seen today for follow-up of his supraventricular tachycardia.  He also has mild hyperlipidemia. Coronary calcium score in January, 2023 was 0 which places him at low risk for developing any coronary artery disease.  He remains very active.  He played football in college for [employer].  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  Still working with [employer] Another grandchild on the way .    Has occasional episode of SVT but he quickly does a Valsalva which resolves the issue  Has not needed a propranolol   Staying active  Cycling and swimming regularly  Is not running any longer   Goes to his house in [place], [place] ([address] )  Hikes 2 miles with extremely steep slopes. Has some DOE with that but thinks this might be just age related.    Current Outpatient Medications on File Prior to Visit  Medication Sig Dispense Refill   losartan (COZAAR) 25 MG tablet Take 1 tablet (25 mg total) by mouth daily. 90 tablet 3   propranolol (INDERAL) 10 MG tablet Take 1 tablet (10 mg total) by mouth 4 (four) times daily as needed. 90 tablet 6   rosuvastatin (CRESTOR) 10 MG tablet Take 1 tablet (10 mg total) by  mouth daily. Please schedule overdue appt for future refills 2nd attempt. 15 tablet 0   aspirin 81 MG EC tablet Take 1 tablet by mouth daily. (Patient not taking: Reported on 03/29/2022)     Multiple Vitamin (MULTI-VITAMIN) tablet Take 1 tablet by mouth daily.     No current facility-administered medications on file prior to visit.    No Known Allergies  Past Medical History:  Diagnosis Date   Allergic rhinitis    Anxiety disorder    Benign prostatic hypertrophy    Bladder outlet obstruction    Hyperlipidemia    Nevus, non-neoplastic    right (of the back)   Sciatica    Sciatica radiculopathy    Tachycardia    History of Intermittent Tachycardia    Past Surgical History:  Procedure Laterality Date   VASECTOMY      Social History   Tobacco Use  Smoking Status Never  Smokeless Tobacco Never    Social History   Substance and Sexual Activity  Alcohol Use No    Family History  Problem Relation Age of Onset   Alzheimer's disease Unknown    Cancer Unknown        Carcinoma of The Large Intestin    Colon cancer Unknown    Liver cancer Unknown    Hypertension Paternal Uncle    Heart disease Neg Hx     Reviw of Systems:  Reviewed in the HPI.  All other systems are negative.  Physical Exam: Blood pressure 132/72, pulse 60, height 6' (1.829 m), weight 228 lb (103.4 kg), SpO2 97 %.       GEN:  Well nourished, well developed in no acute distress HEENT: Normal NECK: No JVD; No carotid bruits LYMPHATICS: No lymphadenopathy CARDIAC: RRR , no murmurs, rubs, gallops RESPIRATORY:  Clear to auscultation without rales, wheezing or rhonchi  ABDOMEN: Soft, non-tender, non-distended MUSCULOSKELETAL:  No edema; No deformity  SKIN: Warm and dry NEUROLOGIC:  Alert and oriented x 3   ECG: March 29, 2022: Normal sinus rhythm at 60.  Left left axis deviation.  Incomplete right bundle branch block.    Assessment / Plan:   1. SVT -he has had occasional episodes of very  brief episodes of SVT.  He is able to terminate these very quickly with the Valsalva maneuver.  I refilled his propranolol to take on an as-needed basis.  2. Mild hypercholesterolemia.     He has mild hyperlipidemia and is on low-dose rosuvastatin.  His lipids have been well-controlled.  His  coronary calcium score is 0.  Given the low coronary calcium score I given him the option of discontinuing the rosuvastatin if he thinks it is causing any muscle aches and pains.  He does not think it is causing him any issues.  In addition, his glucose levels have been fine and the rosuvastatin does not seem to be causing any hypoglycemia. Continue for now.  He is also on aspirin 81 mg a day.  Also reminded him that that is probably elective at this point.  3.  Hypertension: He is on low-dose losartan.  Continue current medications.  I will plan on seeing him again in 1 year.     Kristeen Miss, MD  03/29/2022 4:48 PM    [facility] [address] [place],  [address] [place], [place]  [address] Pager [phone] Phone: [phone]; Fax: [phone]

## 2022-03-29 ENCOUNTER — Ambulatory Visit: Payer: BC Managed Care – PPO | Attending: Cardiovascular Disease | Admitting: Cardiovascular Disease

## 2022-03-29 ENCOUNTER — Encounter: Payer: Self-pay | Admitting: Cardiovascular Disease

## 2022-03-29 VITALS — BP 132/72 | HR 60 | Ht 72.0 in | Wt 228.0 lb

## 2022-03-29 DIAGNOSIS — E782 Mixed hyperlipidemia: Secondary | ICD-10-CM | POA: Diagnosis not present

## 2022-03-29 DIAGNOSIS — I471 Supraventricular tachycardia, unspecified: Secondary | ICD-10-CM

## 2022-03-29 MED ORDER — ROSUVASTATIN CALCIUM 10 MG PO TABS: 10.0000 mg | ORAL_TABLET | Freq: Every day | ORAL | 3 refills | Status: DC

## 2022-03-29 MED ORDER — PROPRANOLOL HCL 10 MG PO TABS: 10.0000 mg | ORAL_TABLET | Freq: Four times a day (QID) | ORAL | 0 refills | Status: AC | PRN

## 2022-03-29 NOTE — Patient Instructions (Signed)
Medication Instructions:  REFILLED Propranolol 10mg   *If you need a refill on your cardiac medications before your next appointment, please call your pharmacy*   Lab Work: NONE If you have labs (blood work) drawn today and your tests are completely normal, you will receive your results only by: [place] (if you have MyChart) OR A paper copy in the mail If you have any lab test that is abnormal or we need to change your treatment, we will call you to review the results.   Testing/Procedures: NONE   Follow-Up: At [facility], you and your health needs are our priority.  As part of our continuing mission to provide you with exceptional heart care, we have created designated Provider Care Teams.  These Care Teams include your primary Cardiologist (physician) and Advanced Practice Providers (APPs -  Physician Assistants and Nurse Practitioners) who all work together to provide you with the care you need, when you need it.  We recommend signing up for the patient portal called "MyChart".  Sign up information is provided on this After Visit Summary.  MyChart is used to connect with patients for Virtual Visits (Telemedicine).  Patients are able to view lab/test results, encounter notes, upcoming appointments, etc.  Non-urgent messages can be sent to your provider as well.   To learn more about what you can do with MyChart, go to [email or web address].    Your next appointment:   1 year(s)  Provider:   Mertie Moores, MD

## 2022-04-02 ENCOUNTER — Other Ambulatory Visit: Payer: Self-pay | Admitting: Cardiovascular Disease

## 2023-03-11 ENCOUNTER — Other Ambulatory Visit: Payer: Self-pay | Admitting: Cardiovascular Disease

## 2023-06-18 ENCOUNTER — Encounter: Payer: Self-pay | Admitting: Cardiovascular Disease

## 2023-06-18 NOTE — Progress Notes (Signed)
Pt cancelled  This encounter was created in error - please disregard. 

## 2023-06-19 ENCOUNTER — Ambulatory Visit: Payer: Self-pay | Admitting: Cardiovascular Disease

## 2023-07-13 ENCOUNTER — Other Ambulatory Visit: Payer: Self-pay

## 2023-07-13 MED ORDER — ROSUVASTATIN CALCIUM 10 MG PO TABS: 10.0000 mg | ORAL_TABLET | Freq: Every day | ORAL | 1 refills | Status: DC

## 2023-08-23 ENCOUNTER — Encounter: Payer: Self-pay | Admitting: Cardiovascular Disease

## 2023-08-23 NOTE — Progress Notes (Signed)
 Francisco Harris. Date of Birth  1960-08-26       [facility] Office [address]  [address] [place], [place]  [address]   [place], [place]  [address] [phone]     [phone]   Fax  [phone]    Fax [phone]  Problem List: 1. SVT 2. hyperlipidemia    Francisco Harris is doing well.  He has not had any episodes of SVT.  Exercises regularly.  Runs , lifts weights, swims regularly.   He has had 2-3 episodes of tachycardia that resolved very quickly after a valsalva.  No CP , no dypsnea.  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   Sept. 4, 2015:  Francisco Harris had a long episode of  SVT  several weeks ago.    He was out coaching during the practice session. It was very hot and humid. He had an episode lasted 25-30 minutes. He tried   multiple Valsalva maneuvers but these were not successful. Eventually came inside and applied a cold towel to us  for it and had some cold water. Eventually the SVT resolved and has not returned since that time.  He was a bit more fatigued the next day but overall has recovered nicely.   He has not taken any propranolol .    01/27/2015:  Francisco Harris is doing well.  Occasional episodes of SVT Exercising regularly .   His cholesterol is a bit high .  Is rertired from teaching and coaching  Nov. 28, 2017:  Francisco Harris is seen today for follow-up of his supraventricular tachycardia.   BP is a bit elevated today . Ate a bit more salt this past Thanksgiving weekend.   Has brief episodes of SVT every 5 weeks or so He is able to convert himself with the valsalva.  Has not had to take a propranolol    Dec. 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   Doing well from an SVT standpoint  - no further episodes of SVT Seems to be better now that he is staying better hydrated.  BP has been well controlled  Now on Losartan  25 mg a day  - BP has been  great as long as he exercises regularly .   Oct. 27, 2021:  Francisco Harris is seen today for follow up of his SVT and mild hyperlipidemia Working out regularly  Has occasional episodes of SVT . Might have an episode every 4-5 weeks.  Does a Valsalva and the episodes resolve quickly .  Lipids from [facility] look good    March 09, 2021:  Family hx of HLD, no CAD  Francisco Harris is seen today for follow-up of his supraventricular tachycardia.  He has mild hyperlipidemia.  He works out on a regular basis.  He is retired from [employer]. Has rare episodes of SVT  - resolves these with a quick valsalva. Also has PVCs   Has stopped running for the most part Now has doe with running  Is cycling , swims in the winter .    March 29, 2022: Francisco Harris is seen today for follow-up of his supraventricular tachycardia.  He also has mild hyperlipidemia. Coronary calcium  score in January, 2023 was 0 which places him at low risk for developing any coronary artery disease.  He remains very active.  He played football in college for [employer].  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    Has occasional episode of SVT but he quickly does a Valsalva which resolves the issue  Has not needed a propranolol    Staying active  Cycling and swimming regularly  Is not running any longer   Goes to his house in [place], [place] [facility] [place] [place] )  Hikes 2 miles with extremely steep slopes. Has some DOE with that but thinks this might be just age related.    August 24, 2023 Francisco Harris is seen for follow up of his SVT, mild hyperlipidemia ,   Has been doing football camps for the past week or so  No significant episodes of SVT He can feel an episode coming on, does a quick Valsalva  Then its gone Swims regularly 45 min - 1 hour Intervals, also long slow distance  Cycling ~ 8-10 miles  Runs some but notices lots of joint  pain   Does have some mild and short lived DOE when he's jogging across the football field .   His last LDL was 89 ( [facility], [place] [facility] ) Feb, 2025  Hb A1C is 5.7   Current Outpatient Medications on File Prior to Visit  Medication Sig Dispense Refill   aspirin 81 MG EC tablet Take 1 tablet by mouth daily.     losartan  (COZAAR ) 25 MG tablet Take 1 tablet (25 mg total) by mouth daily. 90 tablet 3   Multiple Vitamin (MULTI-VITAMIN) tablet Take 1 tablet by mouth daily.     rosuvastatin  (CRESTOR ) 10 MG tablet Take 1 tablet (10 mg total) by mouth daily. 30 tablet 1   propranolol  (INDERAL ) 10 MG tablet Take 1 tablet (10 mg total) by mouth 4 (four) times daily as needed. (Patient not taking: Reported on 08/24/2023) 30 tablet 0   No current facility-administered medications on file prior to visit.    No Known Allergies  Past Medical History:  Diagnosis Date   Allergic rhinitis    Anxiety disorder    Benign prostatic hypertrophy    Bladder outlet obstruction    Hyperlipidemia    Nevus, non-neoplastic    right (of the back)   Sciatica    Sciatica radiculopathy    Tachycardia    History of Intermittent Tachycardia    Past Surgical History:  Procedure Laterality Date   VASECTOMY      Social History   Tobacco Use  Smoking Status Never  Smokeless Tobacco Never    Social History   Substance and Sexual Activity  Alcohol Use No    Family History  Problem Relation Age of Onset   Alzheimer's disease Unknown    Cancer Unknown        Carcinoma of The Large Intestin    Colon cancer Unknown    Liver cancer Unknown    Hypertension Paternal Uncle    Heart disease Neg Hx     Reviw of Systems:  Reviewed in the HPI.  All other systems are negative.  Physical Exam: Blood pressure 120/76, pulse 61, height 6' (1.829 m), weight 232 lb (105.2 kg), SpO2 97%.       GEN:  Well nourished, well developed in no acute distress HEENT: Normal NECK: No JVD; No carotid  bruits LYMPHATICS: No lymphadenopathy CARDIAC: RRR , no murmurs, rubs, gallops RESPIRATORY:  Clear to auscultation without rales, wheezing or rhonchi  ABDOMEN: Soft, non-tender, non-distended MUSCULOSKELETAL:  No edema; No  deformity  SKIN: Warm and dry NEUROLOGIC:  Alert and oriented x 3      ECG: EKG Interpretation Date/Time:  Friday August 24 2023 09:27:18 EDT Ventricular Rate:  58 PR Interval:  144 QRS Duration:  92 QT Interval:  430 QTC Calculation: 422 R Axis:   -13  Text Interpretation: Sinus bradycardia No previous ECGs available Confirmed by Alveta Mungo ([id number]) on 08/24/2023 12:42:02 PM       Assessment / Plan:   1. SVT - he has very rare episodes of episodes of SVT .    He typically is able to control these by doing a Valsalva as soon as it starts .    He has propranolol  10 mg tabs to take if he has persistent SVT   At this point , there is no indication for referral to EP or ablation . I've advised him to continue to stay hydrated.   Continue to follow   2. Mild hypercholesterolemia.     He has mild hyperlipidemia and is on low-dose rosuvastatin .  His lipids have been well-controlled.  His coronary calcium  score is 0.  Given the low coronary calcium  score I given him the option of discontinuing the rosuvastatin  if he thinks it is causing any muscle aches and pains.  He does not think it is causing him any issues.  In addition, his glucose levels have been fine and the rosuvastatin  does not seem to be causing any hypoglycemia.    He is also on aspirin 81 mg a day.  Also reminded him that that is probably elective at this point.  3.  Hypertension:  well controlled.   I will plan on seeing him again in 1 year.     Mungo Alveta, MD  08/24/2023 12:42 PM    [facility] [address] [place],  [address] [place], [place]  [address] Pager [phone] Phone: [phone]; Fax: [phone]

## 2023-08-24 ENCOUNTER — Ambulatory Visit: Attending: Cardiovascular Disease | Admitting: Cardiovascular Disease

## 2023-08-24 ENCOUNTER — Encounter: Payer: Self-pay | Admitting: Cardiovascular Disease

## 2023-08-24 VITALS — BP 120/76 | HR 61 | Ht 72.0 in | Wt 232.0 lb

## 2023-08-24 DIAGNOSIS — I10 Essential (primary) hypertension: Secondary | ICD-10-CM

## 2023-08-24 DIAGNOSIS — E782 Mixed hyperlipidemia: Secondary | ICD-10-CM | POA: Diagnosis not present

## 2023-08-24 NOTE — Patient Instructions (Signed)
 Follow-Up: At [facility], you and your health needs are our priority.  As part of our continuing mission to provide you with exceptional heart care, our providers are all part of one team.  This team includes your primary Cardiologist (physician) and Advanced Practice Providers or APPs (Physician Assistants and Nurse Practitioners) who all work together to provide you with the care you need, when you need it.  Your next appointment:   1 year(s)  Provider:   Ahmad Alert, MD Luana Rumple, MD

## 2023-09-12 ENCOUNTER — Other Ambulatory Visit: Payer: Self-pay | Admitting: Cardiovascular Disease
